# Patient Record
Sex: Female | Born: 1985 | Race: White | Hispanic: No | Marital: Married | Smoking: Never smoker
Health system: Southern US, Community
[De-identification: ages and names within clinical notes are randomized; demographics above are authoritative.]

## PROBLEM LIST (undated history)

## (undated) DIAGNOSIS — Z87440 Personal history of urinary (tract) infections: Secondary | ICD-10-CM

## (undated) DIAGNOSIS — O034 Incomplete spontaneous abortion without complication: Secondary | ICD-10-CM

## (undated) DIAGNOSIS — I10 Essential (primary) hypertension: Secondary | ICD-10-CM

## (undated) DIAGNOSIS — D509 Iron deficiency anemia, unspecified: Secondary | ICD-10-CM

## (undated) DIAGNOSIS — R011 Cardiac murmur, unspecified: Secondary | ICD-10-CM

## (undated) DIAGNOSIS — Z789 Other specified health status: Secondary | ICD-10-CM

## (undated) DIAGNOSIS — K5100 Ulcerative (chronic) pancolitis without complications: Secondary | ICD-10-CM

## (undated) DIAGNOSIS — Z8719 Personal history of other diseases of the digestive system: Secondary | ICD-10-CM

## (undated) DIAGNOSIS — Z973 Presence of spectacles and contact lenses: Secondary | ICD-10-CM

## (undated) DIAGNOSIS — A09 Infectious gastroenteritis and colitis, unspecified: Secondary | ICD-10-CM

## (undated) DIAGNOSIS — N390 Urinary tract infection, site not specified: Secondary | ICD-10-CM

## (undated) DIAGNOSIS — Z8632 Personal history of gestational diabetes: Secondary | ICD-10-CM

## (undated) DIAGNOSIS — K922 Gastrointestinal hemorrhage, unspecified: Secondary | ICD-10-CM

## (undated) HISTORY — PX: BREAST EXCISIONAL BIOPSY: SUR124

## (undated) HISTORY — DX: Infectious gastroenteritis and colitis, unspecified: A09

## (undated) HISTORY — DX: Gastrointestinal hemorrhage, unspecified: K92.2

## (undated) HISTORY — DX: Urinary tract infection, site not specified: N39.0

## (undated) HISTORY — DX: Essential (primary) hypertension: I10

---
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
## 2003-07-25 ENCOUNTER — Emergency Department (HOSPITAL_COMMUNITY): Admission: EM | Admit: 2003-07-25 | Discharge: 2003-07-25 | Payer: Self-pay | Admitting: Emergency Medicine

## 2003-08-08 ENCOUNTER — Other Ambulatory Visit: Admission: RE | Admit: 2003-08-08 | Discharge: 2003-08-08 | Payer: Self-pay | Admitting: Obstetrics and Gynecology

## 2003-08-11 ENCOUNTER — Encounter: Admission: RE | Admit: 2003-08-11 | Discharge: 2003-08-11 | Payer: Self-pay | Admitting: Obstetrics and Gynecology

## 2004-05-30 ENCOUNTER — Encounter (INDEPENDENT_AMBULATORY_CARE_PROVIDER_SITE_OTHER): Payer: Self-pay | Admitting: *Deleted

## 2004-05-30 ENCOUNTER — Encounter: Admission: RE | Admit: 2004-05-30 | Discharge: 2004-05-30 | Payer: Self-pay | Admitting: Obstetrics and Gynecology

## 2004-07-09 ENCOUNTER — Ambulatory Visit: Payer: Self-pay | Admitting: Internal Medicine

## 2004-08-06 ENCOUNTER — Ambulatory Visit: Payer: Self-pay | Admitting: Internal Medicine

## 2004-08-28 ENCOUNTER — Other Ambulatory Visit: Admission: RE | Admit: 2004-08-28 | Discharge: 2004-08-28 | Payer: Self-pay | Admitting: Obstetrics and Gynecology

## 2005-09-04 ENCOUNTER — Other Ambulatory Visit: Admission: RE | Admit: 2005-09-04 | Discharge: 2005-09-04 | Payer: Self-pay | Admitting: Obstetrics and Gynecology

## 2005-09-12 ENCOUNTER — Encounter: Admission: RE | Admit: 2005-09-12 | Discharge: 2005-09-12 | Payer: Self-pay | Admitting: Obstetrics and Gynecology

## 2007-12-15 ENCOUNTER — Ambulatory Visit (HOSPITAL_BASED_OUTPATIENT_CLINIC_OR_DEPARTMENT_OTHER): Admission: RE | Admit: 2007-12-15 | Discharge: 2007-12-15 | Payer: Self-pay | Admitting: Surgery

## 2007-12-15 ENCOUNTER — Encounter (INDEPENDENT_AMBULATORY_CARE_PROVIDER_SITE_OTHER): Payer: Self-pay | Admitting: Surgery

## 2007-12-15 HISTORY — PX: BREAST MASS EXCISION: SHX1267

## 2010-11-12 ENCOUNTER — Other Ambulatory Visit: Payer: Self-pay | Admitting: Obstetrics and Gynecology

## 2010-12-04 NOTE — Op Note (Signed)
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   POSTOPERATIVE DIAGNOSIS:  Left breast mass, probable fibroadenoma.   OPERATION:  Removal left breast mass.   SURGEON:  Haywood Lasso, MD.   ANESTHESIA:  MAC.   CLINICAL HISTORY:  This is a 25 year old young lady with a gradually  enlarging left breast mass, which appeared to be clinically benign  fibroadenoma.  However, because it was enlarging, she decided to have  this removed.   DESCRIPTION OF PROCEDURE:  The patient was seen in the holding area and  had no further questions.  We both marked the left breast as the  operative site.  The patient was then taken to the operating room, and  after satisfactory IV sedation, the left breast was prepped and draped.  The time-out was done.   I infiltrated a combination of 1% Xylocaine and 0.5% Marcaine with  epinephrine mixed equally for local.  I made a curvilinear incision from  about the 12 o'clock to the 9 o'clock position of the left breast at the  areolar margin.  Dividing a little bit of the subcutaneous tissue, I  could see a very lateral edge of the mass, has extended basically from  the areola medially.  Once I had this freed up a little bit, I put a  suture through it for traction.  Then using the cautery, I excised the  mass in toto and apparently intact.  It was very superficial and really  close to the skin.   Once it was out, I sent to pathology.  I made sure everything was dry  and then closed in layers with some 3-0 Vicryl and 4-0 Monocryl  subcuticular plus Dermabond.   The patient tolerated the  procedure well.  There were no complications.  All counts were correct.      Haywood Lasso, M.D.  Electronically Signed     CJS/MEDQ  D:  12/15/2007  T:  12/16/2007  Job:  [id number]   cc:   Lenard Galloway, M.D.

## 2011-02-20 ENCOUNTER — Encounter (HOSPITAL_COMMUNITY)
Admission: RE | Admit: 2011-02-20 | Discharge: 2011-02-20 | Disposition: A | Discharge status: Home / Self Care | Payer: Commercial Managed Care - PPO | Source: Ambulatory Visit | Attending: Obstetrics and Gynecology | Admitting: Obstetrics and Gynecology

## 2011-02-20 ENCOUNTER — Encounter (HOSPITAL_COMMUNITY): Payer: Self-pay

## 2011-02-20 ENCOUNTER — Inpatient Hospital Stay (HOSPITAL_COMMUNITY): Admission: RE | Admit: 2011-02-20 | Payer: 59 | Source: Ambulatory Visit

## 2011-02-20 HISTORY — DX: Other specified health status: Z78.9

## 2011-02-20 LAB — CBC
MCH: 29.4 pg (ref 26.0–34.0)
MCHC: 33.1 g/dL (ref 30.0–36.0)
Platelets: 230 10*3/uL (ref 150–400)
RDW: 12.9 % (ref 11.5–15.5)

## 2011-02-20 LAB — URINALYSIS, ROUTINE W REFLEX MICROSCOPIC
Glucose, UA: NEGATIVE mg/dL
Ketones, ur: NEGATIVE mg/dL
Leukocytes, UA: NEGATIVE
Nitrite: NEGATIVE
Protein, ur: NEGATIVE mg/dL
Urobilinogen, UA: 0.2 mg/dL (ref 0.0–1.0)

## 2011-02-20 NOTE — Patient Instructions (Addendum)
[place]  02/20/2011   Your procedure is scheduled on:  02/26/11  Report to [facility] at 8 AM.  Call this number if you have problems the morning of surgery: [phone]   Remember:   Do not eat food:After Midnight.  Do not drink clear liquids: After Midnight.  Take these medicines the morning of surgery with A SIP OF WATER: NA   Do not wear jewelry, make-up or nail polish.  Do not wear lotions, powders, or perfumes. You may wear deodorant.  Do not shave 48 hours prior to surgery.  Do not bring valuables to the hospital.  Contacts, dentures or bridgework may not be worn into surgery.  Leave suitcase in the car. After surgery it may be brought to your room.  For patients admitted to the hospital, checkout time is 11:00 AM the day of discharge.   Patients discharged the day of surgery will not be allowed to drive home.  Name and phone number of your driver: grandmother  Loretta Boone  Special Instructions: use CHG wash per written instruction sheet   Please read over the following fact sheets that you were given: MRSA Information

## 2011-02-25 NOTE — H&P (Signed)
NAMESAMIRA, ACERO NO.:  [id number]  MEDICAL RECORD NO.:  [id number]  LOCATION:  [place]                           FACILITY:  [place]  PHYSICIAN:  Lenard Galloway, M.D.   DATE OF BIRTH:  [birth year]  DATE OF ADMISSION:  02/20/2011 DATE OF DISCHARGE:  02/20/2011                             HISTORY & PHYSICAL   Preoperative history and physical examination dated on February 20, 2011. Surgery is scheduled for the [facility] on February 26, 2011.  CHIEF COMPLAINT:  Right lower quadrant pain.  HISTORY OF PRESENT ILLNESS:  The patient is a 25 year old gravida 0, Caucasian female, who presents with a history of progressive right lower quadrant pain beginning in April 2012.  The patient has had a pelvic ultrasound documenting a complex right ovarian cyst which measured 4.5 cm on Dec 12, 2010 and has slightly increased in size to 4.9 cm on February 01, 2011.  Neither ultrasound demonstrated any increase in blood flow by colored Doppler.  The left ovary has always remained normal and the uterus was on remarkable.  The patient has been on Ovcon birth control pills during this time.  She has been taking nonsteroidal antiinflammatory medication, tramadol, or extra strength Tylenol p.m. to try to control the discomfort.  The pain appears to be increasing and the patient thus requesting surgical evaluation and treatment.  PAST OBSTETRIC AND GYNECOLOGIC HISTORIES:  The patient is status post excision of a left breast fibroadenoma in 2009.  The patient is currently on Ovcon 35 birth control pills.  She has never been pregnant. The patient's last set of gonorrhea and Chlamydia cultures were performed in April 2011 and were both negative.  PAST MEDICAL HISTORY: 1. History of cat scratch disease. 2. TMJ. 3. Adult acne. 4. Environmental allergies  PAST SURGICAL HISTORY:  Status post excision of left breast fibroadenoma in 2009.  MEDICATIONS: 1. Ovcon 35 birth control  pills. 2. Clindamycin lotion. 3. Different cream. 4. Multivitamin. 5. Zyrtec.  ALLERGIES:  MINOCYCLINE.  SOCIAL HISTORY:  The patient is single.  She has no children.  She is a Equities trader.  She denies the use of tobacco, alcohol, or illicit drugs.  FAMILY HISTORY:  Negative for breast, uterine, ovarian, or colon cancer.  REVIEW OF SYSTEMS:  Please refer to history of present illness.  PHYSICAL EXAMINATION:  VITAL SIGNS:  Height of 5 feet 4 inches, weight 154 pounds, and blood pressure 100/64. NECK:  Negative for adenopathy and thyromegaly. LUNGS:  Clear to auscultation bilaterally. HEART:  S1 and S2 with a regular rate and rhythm. ABDOMEN:  Soft and nontender and without evidence of hepatosplenomegaly or organomegaly. PELVIC:  Normal external genitalia and urethra.  The cervix and vagina demonstrate no lesions.  The uterus is small and nontender.  There is a soft fullness in the right lower quadrant without evidence of tenderness.  There is no evidence of any left adnexal mass or tenderness.  IMPRESSION:  The patient is a 25 year old gravida 0 female with a persistent complex right ovarian cyst and right lower quadrant pain.  PLAN:  The patient will undergo a laparoscopic right ovarian cystectomy with possible right salpingo-oophorectomy.  Risks, benefits, and alternatives have been discussed  with the patient who wishes to proceed.     Lenard Galloway, M.D.     BES/MEDQ  D:  02/25/2011  T:  02/25/2011  Job:  [id number]

## 2011-02-26 ENCOUNTER — Ambulatory Visit (HOSPITAL_COMMUNITY)
Admission: RE | Admit: 2011-02-26 | Discharge: 2011-02-26 | Disposition: A | Discharge status: Home / Self Care | Payer: Commercial Managed Care - PPO | Source: Ambulatory Visit | Attending: Obstetrics and Gynecology | Admitting: Obstetrics and Gynecology

## 2011-02-26 ENCOUNTER — Other Ambulatory Visit: Payer: Self-pay | Admitting: Obstetrics and Gynecology

## 2011-02-26 ENCOUNTER — Encounter (HOSPITAL_COMMUNITY)
Admission: RE | Disposition: A | Discharge status: Home / Self Care | Payer: Self-pay | Source: Ambulatory Visit | Attending: Obstetrics and Gynecology

## 2011-02-26 ENCOUNTER — Ambulatory Visit (HOSPITAL_COMMUNITY): Payer: Commercial Managed Care - PPO | Admitting: Anesthesiology

## 2011-02-26 ENCOUNTER — Encounter (HOSPITAL_COMMUNITY): Payer: Self-pay | Admitting: Anesthesiology

## 2011-02-26 DIAGNOSIS — N83209 Unspecified ovarian cyst, unspecified side: Secondary | ICD-10-CM | POA: Insufficient documentation

## 2011-02-26 DIAGNOSIS — Z01812 Encounter for preprocedural laboratory examination: Secondary | ICD-10-CM | POA: Insufficient documentation

## 2011-02-26 DIAGNOSIS — R1031 Right lower quadrant pain: Secondary | ICD-10-CM | POA: Insufficient documentation

## 2011-02-26 DIAGNOSIS — Z01818 Encounter for other preprocedural examination: Secondary | ICD-10-CM | POA: Insufficient documentation

## 2011-02-26 HISTORY — PX: OVARIAN CYST REMOVAL: SHX89

## 2011-02-26 HISTORY — PX: LAPAROSCOPY: SHX197

## 2011-02-26 SURGERY — LAPAROSCOPY OPERATIVE
Anesthesia: General | Site: Abdomen | Laterality: Right

## 2011-02-26 MED ORDER — MIDAZOLAM HCL 2 MG/2ML IJ SOLN
INTRAMUSCULAR | Status: AC
  Filled 2011-02-26: qty 2

## 2011-02-26 MED ORDER — NEOSTIGMINE METHYLSULFATE 1 MG/ML IJ SOLN
INTRAMUSCULAR | Status: DC | PRN
  Administered 2011-02-26: 2 mg via INTRAMUSCULAR

## 2011-02-26 MED ORDER — FENTANYL CITRATE 0.05 MG/ML IJ SOLN
INTRAMUSCULAR | Status: AC
  Administered 2011-02-26: 25 ug via INTRAVENOUS
  Filled 2011-02-26: qty 2

## 2011-02-26 MED ORDER — CEFAZOLIN SODIUM 1-5 GM-% IV SOLN
INTRAVENOUS | Status: AC
  Filled 2011-02-26: qty 50

## 2011-02-26 MED ORDER — LACTATED RINGERS IR SOLN
Status: DC | PRN
  Administered 2011-02-26: 3000 mL

## 2011-02-26 MED ORDER — BUPIVACAINE HCL (PF) 0.25 % IJ SOLN
INTRAMUSCULAR | Status: DC | PRN
  Administered 2011-02-26: 10 mL

## 2011-02-26 MED ORDER — ROCURONIUM BROMIDE 50 MG/5ML IV SOLN
INTRAVENOUS | Status: AC
  Filled 2011-02-26: qty 1

## 2011-02-26 MED ORDER — FENTANYL CITRATE 0.05 MG/ML IJ SOLN
INTRAMUSCULAR | Status: DC | PRN
  Administered 2011-02-26: 100 ug via INTRAVENOUS
  Administered 2011-02-26: 150 ug via INTRAVENOUS

## 2011-02-26 MED ORDER — KETOROLAC TROMETHAMINE 30 MG/ML IJ SOLN: 15.0000 mg | Freq: Once | INTRAMUSCULAR | Status: DC | PRN

## 2011-02-26 MED ORDER — PANTOPRAZOLE SODIUM 40 MG PO TBEC: 40.0000 mg | DELAYED_RELEASE_TABLET | Freq: Once | ORAL | Status: DC | PRN

## 2011-02-26 MED ORDER — GLYCOPYRROLATE 0.2 MG/ML IJ SOLN
INTRAMUSCULAR | Status: DC | PRN
  Administered 2011-02-26: .4 mg via INTRAVENOUS

## 2011-02-26 MED ORDER — CITRIC ACID-SODIUM CITRATE 334-500 MG/5ML PO SOLN: 30.0000 mL | Freq: Once | ORAL | Status: DC | PRN

## 2011-02-26 MED ORDER — LIDOCAINE HCL (CARDIAC) 20 MG/ML IV SOLN
INTRAVENOUS | Status: AC
  Filled 2011-02-26: qty 5

## 2011-02-26 MED ORDER — MIDAZOLAM HCL 5 MG/5ML IJ SOLN
INTRAMUSCULAR | Status: DC | PRN
  Administered 2011-02-26: 2 mg via INTRAVENOUS

## 2011-02-26 MED ORDER — LIDOCAINE HCL (PF) 2 % IJ SOLN
INTRAMUSCULAR | Status: DC | PRN
  Administered 2011-02-26: 100 mg

## 2011-02-26 MED ORDER — KETOROLAC TROMETHAMINE 30 MG/ML IJ SOLN
INTRAMUSCULAR | Status: DC | PRN
  Administered 2011-02-26: 30 mg via INTRAVENOUS

## 2011-02-26 MED ORDER — ONDANSETRON HCL 4 MG/2ML IJ SOLN
INTRAMUSCULAR | Status: DC | PRN
  Administered 2011-02-26: 4 mg via INTRAVENOUS

## 2011-02-26 MED ORDER — FENTANYL CITRATE 0.05 MG/ML IJ SOLN
25.0000 ug | INTRAMUSCULAR | Status: DC | PRN
  Administered 2011-02-26 (×2): 25 ug via INTRAVENOUS

## 2011-02-26 MED ORDER — PROPOFOL 10 MG/ML IV EMUL
INTRAVENOUS | Status: AC
  Filled 2011-02-26: qty 20

## 2011-02-26 MED ORDER — FENTANYL CITRATE 0.05 MG/ML IJ SOLN
INTRAMUSCULAR | Status: AC
  Filled 2011-02-26: qty 5

## 2011-02-26 MED ORDER — CEFAZOLIN SODIUM 1-5 GM-% IV SOLN
1.0000 g | INTRAVENOUS | Status: AC
  Administered 2011-02-26: 1 g via INTRAVENOUS

## 2011-02-26 MED ORDER — SCOPOLAMINE 1 MG/3DAYS TD PT72: 1.0000 | MEDICATED_PATCH | Freq: Once | TRANSDERMAL | Status: DC | PRN

## 2011-02-26 MED ORDER — ROCURONIUM BROMIDE 100 MG/10ML IV SOLN
INTRAVENOUS | Status: DC | PRN
  Administered 2011-02-26: 40 mg via INTRAVENOUS

## 2011-02-26 MED ORDER — ONDANSETRON HCL 4 MG/2ML IJ SOLN
INTRAMUSCULAR | Status: AC
  Filled 2011-02-26: qty 2

## 2011-02-26 MED ORDER — METOCLOPRAMIDE HCL 10 MG PO TABS: 10.0000 mg | ORAL_TABLET | Freq: Once | ORAL | Status: DC | PRN

## 2011-02-26 MED ORDER — HEPARIN SODIUM (PORCINE) 5000 UNIT/ML IJ SOLN
INTRAMUSCULAR | Status: DC | PRN
  Administered 2011-02-26: 5000 [IU] via SUBCUTANEOUS

## 2011-02-26 MED ORDER — PROPOFOL 10 MG/ML IV EMUL
INTRAVENOUS | Status: DC | PRN
  Administered 2011-02-26: 150 mg via INTRAVENOUS

## 2011-02-26 MED ORDER — GLYCOPYRROLATE 0.2 MG/ML IJ SOLN
INTRAMUSCULAR | Status: AC
  Filled 2011-02-26: qty 2

## 2011-02-26 MED ORDER — DEXAMETHASONE SODIUM PHOSPHATE 10 MG/ML IJ SOLN
INTRAMUSCULAR | Status: AC
  Filled 2011-02-26: qty 1

## 2011-02-26 MED ORDER — NEOSTIGMINE METHYLSULFATE 1 MG/ML IJ SOLN
INTRAMUSCULAR | Status: AC
  Filled 2011-02-26: qty 10

## 2011-02-26 MED ORDER — DEXAMETHASONE SODIUM PHOSPHATE 10 MG/ML IJ SOLN
INTRAMUSCULAR | Status: DC | PRN
  Administered 2011-02-26: 10 mg via INTRAVENOUS

## 2011-02-26 MED ORDER — KETOROLAC TROMETHAMINE 30 MG/ML IJ SOLN
INTRAMUSCULAR | Status: AC
  Filled 2011-02-26: qty 1

## 2011-02-26 MED ORDER — FAMOTIDINE 20 MG PO TABS: 20.0000 mg | ORAL_TABLET | Freq: Once | ORAL | Status: DC | PRN

## 2011-02-26 MED ORDER — LACTATED RINGERS IV SOLN
INTRAVENOUS | Status: DC
  Administered 2011-02-26 (×2): via INTRAVENOUS
  Administered 2011-02-26: 20 mL/h via INTRAVENOUS

## 2011-02-26 MED ORDER — OXYCODONE-ACETAMINOPHEN 5-325 MG PO TABS: 1.0000 | ORAL_TABLET | ORAL | Status: AC | PRN

## 2011-02-26 SURGICAL SUPPLY — 26 items
BARRIER ADHS 3X4 INTERCEED (GAUZE/BANDAGES/DRESSINGS) ×3 IMPLANT
BENZOIN TINCTURE PRP APPL 2/3 (GAUZE/BANDAGES/DRESSINGS) IMPLANT
CABLE HIGH FREQUENCY MONO STRZ (ELECTRODE) ×3 IMPLANT
CANISTER SUCTION 2500CC (MISCELLANEOUS) ×3 IMPLANT
CLOTH BEACON ORANGE TIMEOUT ST (SAFETY) ×3 IMPLANT
DERMABOND ADVANCED (GAUZE/BANDAGES/DRESSINGS) ×6 IMPLANT
DRAPE UTILITY XL STRL (DRAPES) IMPLANT
DRESSING TELFA 8X3 (GAUZE/BANDAGES/DRESSINGS) ×3 IMPLANT
FORCEPS CUTTING 33CM 5MM (CUTTING FORCEPS) IMPLANT
GLOVE BIO SURGEON STRL SZ 6.5 (GLOVE) ×6 IMPLANT
GLOVE BIO SURGEON STRL SZ8 (GLOVE) IMPLANT
GOWN PREVENTION PLUS LG XLONG (DISPOSABLE) ×6 IMPLANT
NS IRRIG 1000ML POUR BTL (IV SOLUTION) IMPLANT
PACK LAPAROSCOPY BASIN (CUSTOM PROCEDURE TRAY) ×3 IMPLANT
POUCH SPECIMEN RETRIEVAL 10MM (ENDOMECHANICALS) ×3 IMPLANT
SCISSORS LAP 5X35 DISP (ENDOMECHANICALS) ×3 IMPLANT
SET IRRIG TUBING LAPAROSCOPIC (IRRIGATION / IRRIGATOR) ×3 IMPLANT
SOLUTION ELECTROLUBE (MISCELLANEOUS) IMPLANT
STRIP CLOSURE SKIN 1/4X3 (GAUZE/BANDAGES/DRESSINGS) IMPLANT
SUT PLAIN 3 0 FS 2 27 (SUTURE) ×3 IMPLANT
SUT VICRYL 0 UR6 27IN ABS (SUTURE) ×3 IMPLANT
TOWEL OR 17X24 6PK STRL BLUE (TOWEL DISPOSABLE) ×6 IMPLANT
TRAY FOLEY CATH 14FR (SET/KITS/TRAYS/PACK) ×3 IMPLANT
TROCAR Z-THREAD BLADED 11X100M (TROCAR) ×3 IMPLANT
WARMER LAPAROSCOPE (MISCELLANEOUS) ×3 IMPLANT
WATER STERILE IRR 1000ML POUR (IV SOLUTION) IMPLANT

## 2011-02-26 NOTE — Anesthesia Postprocedure Evaluation (Signed)
Anesthesia Post Note  Patient: Loretta Boone  Procedure(s) Performed:  LAPAROSCOPY OPERATIVE; OVARIAN CYSTECTOMY  Anesthesia type: General  Patient location: PACU  Post pain: Pain level controlled  Post assessment: Post-op Vital signs reviewed  Last Vitals:  Filed Vitals:   02/26/11 1145  BP: 108/58  Pulse: 72  Temp:   Resp: 14    Post vital signs: Reviewed  Level of consciousness: sedated  Complications: No apparent anesthesia complications

## 2011-02-26 NOTE — Anesthesia Procedure Notes (Signed)
Procedures

## 2011-02-26 NOTE — H&P (Signed)
NAMEJAMES, LAFALCE NO.:  [id number]      MEDICAL RECORD NO.:  [id number]      LOCATION:  [place]                           FACILITY:  [facility]      PHYSICIAN:  Lenard Galloway, M.D.   DATE OF BIRTH:  22-Oct-1985      DATE OF ADMISSION:  02/20/2011   DATE OF DISCHARGE:  02/20/2011                                 HISTORY & PHYSICAL         Preoperative history and physical examination dated on February 20, 2011.   Surgery is scheduled for the [facility] on February 26, 2011.      CHIEF COMPLAINT:  Right lower quadrant pain.      HISTORY OF PRESENT ILLNESS:  The patient is a 25 year old gravida 0,   Caucasian female, who presents with a history of progressive right lower   quadrant pain beginning in April 2012.  The patient has had a pelvic   ultrasound documenting a complex right ovarian cyst which measured 4.5   cm on Dec 12, 2010 and has slightly increased in size to 4.9 cm on February 01, 2011.  Neither ultrasound demonstrated any increase in blood flow by   colored Doppler.  The left ovary has always remained normal and the   uterus was on remarkable.  The patient has been on Ovcon birth control   pills during this time.  She has been taking nonsteroidal   antiinflammatory medication, tramadol, or extra strength Tylenol p.m. to   try to control the discomfort.  The pain appears to be increasing and   the patient thus requesting surgical evaluation and treatment.      PAST OBSTETRIC AND GYNECOLOGIC HISTORIES:  The patient is status post   excision of a left breast fibroadenoma in 2009.  The patient is   currently on Ovcon 35 birth control pills.  She has never been pregnant.   The patient's last set of gonorrhea and Chlamydia cultures were   performed in April 2011 and were both negative.      PAST MEDICAL HISTORY:   1. History of cat scratch disease.   2. TMJ.   3. A dull acne.   4. Environmental allergies      PAST SURGICAL HISTORY:  Status post  excision of left breast fibroadenoma   in 2009.      MEDICATIONS:   1. Ovcon 35 birth control pills.   2. Clindamycin lotion.   3. Different cream.   4. Multivitamin.   5. Zyrtec.      ALLERGIES:  MINOCYCLINE.      SOCIAL HISTORY:  The patient is single.  She has no children.  She is a   Equities trader.  She denies the use of tobacco, alcohol, or illicit   drugs.      FAMILY HISTORY:  Negative for breast, uterine, ovarian, or colon cancer.      REVIEW OF SYSTEMS:  Please refer to history of present illness.      PHYSICAL EXAMINATION:  VITAL SIGNS:  Height of 5 feet 4 inches, weight   154 pounds, and blood pressure  100/64.   NECK:  Negative for adenopathy and thyromegaly.   LUNGS:  Clear to auscultation bilaterally.   HEART:  S1 and S2 with a regular rate and rhythm.   ABDOMEN:  Soft and nontender and without evidence of hepatosplenomegaly   or organomegaly.   PELVIC:  Normal external genitalia and urethra.  The cervix and vagina   demonstrate no lesions.  The uterus is small and nontender.  There is a   soft fullness in the right lower quadrant without evidence of   tenderness.  There is no evidence of any left adnexal mass or   tenderness.      IMPRESSION:  The patient is a 25 year old gravida 0 female with a   persistent complex right ovarian cyst and right lower quadrant pain.      PLAN:  The patient will undergo a laparoscopic right ovarian cystectomy   with possible right salpingo-oophorectomy.  Risks, benefits, and   alternatives have been discussed with the patient who wishes to proceed.               Lenard Galloway, M.D.               BES/MEDQ  D:  02/25/2011  T:  02/25/2011  Job:  [id number]

## 2011-02-26 NOTE — Discharge Instructions (Addendum)
Laparoscopy, Diagnostic Laparoscopy is a surgical procedure. It is used to diagnose and treat diseases inside the belly (abdomen). It is usually a brief, common, and relatively simple procedure. The laparoscope is a thin, lighted, pencil-sized instrument. It is like a telescope. It is inserted into your abdomen through a small cut (incision). Your caregiver can look at the organs inside your body through this instrument. He or she can see if there is anything abnormal. LAPAROSCOPY OVERVIEW Laparoscopy can be done either in a hospital or outpatient clinic. You may be given a mild sedative to help you relax before the procedure. Once in the operating room, you will be given a drug to make you sleep (general anesthesia). Laparoscopy usually lasts less than 1 hour. After the procedure, you will be monitored in a recovery area until you are stable and doing well. Once you are home, it will take 2 to 3 days to fully recover. RISKS AND COMPLICATIONS Laparoscopy has relatively few risks. Your caregiver will discuss the risks with you before the procedure. Some problems that can occur include:  Infection.   Bleeding.   Damage to other organs.   Anesthetic side effects.  THE PROCEDURE Once you receive anesthesia, your surgeon inflates the abdomen with a harmless gas (carbon dioxide). This makes the organs easier to see. The laparoscope is inserted into the abdomen through a small incision. This allows your surgeon to see into the abdomen. Other small instruments are also inserted into the abdomen through other small openings. Many surgeons attach a video camera to the laparoscope to enlarge the view. During a diagnostic laparoscopy, the surgeon may be looking for inflammation, infection, or cancer. Your surgeon may take tissue samples (biopsies). The samples are sent to a specialist in looking at cells and tissue samples (pathologist). The pathologist examines them under a microscope. Biopsies can help  to diagnose or confirm a disease. AFTER THE PROCEDURE  The gas is released from inside the abdomen.   The incisions are closed with stitches (sutures). Because these incisions are small (usually less than 1/2 inch), there is usually minimal discomfort after the procedure. There may be some mild discomfort in the throat. This is from the tube placed in the throat while you were sleeping. You may have some mild abdominal discomfort. There may also be discomfort from the instrument placement incisions in the abdomen.   The recovery time is shortened as long as there are no complications.   You will rest in a recovery room until stable and doing well. As long as there are no complications, you may be allowed to go home.  HOME CARE INSTRUCTIONS  Take all medicines as directed.   Only take over-the-counter or prescription medicines for pain, discomfort, or fever as directed by your caregiver.   Resume daily activities as directed.   Showers are preferred over baths.   You may resume sexual activities in 1 week or as directed.   Do not drive while taking narcotics.  FINDING OUT THE RESULTS OF YOUR TEST Not all test results are available during your visit. If your test results are not back during the visit, make an appointment with your caregiver to find out the results. Do not assume everything is normal if you have not heard from your caregiver or the medical facility. It is important for you to follow up on all of your test results. SEEK MEDICAL CARE IF:  There is increasing abdominal pain.   There is new pain in the  shoulders (shoulder strap areas).   You feel lightheaded or faint.   You have the chills.   You or your child has an oral temperature above 100.5.   There is pus-like (purulent) drainage from any of the wounds.   You are unable to pass gas or have a bowel movement.   You feel sick to your stomach (nauseous) or throw up (vomit).  MAKE SURE YOU:  Understand these  instructions.   Will watch your condition.   Will get help right away if you are not doing well or get worse.  Document Released: 10/14/2000 Document Re-Released: 10/02/2009 [facility] Patient Information 2011 [place].   Caryl Pina,  I found and removed a right ovarian cyst.  It looked mucinous inside.  Your procedure went well!  Josefa Half, M.D.

## 2011-02-26 NOTE — Brief Op Note (Signed)
02/26/2011  10:51 AM  PATIENT:  Loretta Boone  25 y.o. female  PRE-OPERATIVE DIAGNOSIS:  right ovary cyst;right lower quad pain  POST-OPERATIVE DIAGNOSIS:  right ovary cyst;right lower quad pain  PROCEDURE:  Procedure(s): LAPAROSCOPY OPERATIVE OVARIAN CYSTECTOMY  SURGEON:  Surgeon(s): Brook A Silva  PHYSICIAN ASSISTANT:   ASSISTANTS: Delene Loll  ANESTHESIA:   general  ESTIMATED BLOOD LOSS: * No blood loss amount entered *   BLOOD ADMINISTERED:none  DRAINS: none   LOCAL MEDICATIONS USED:  MARCAINE 10 CC  SPECIMEN:  Source of Specimen:  right ovarian cyst wall and pelvic washings  DISPOSITION OF SPECIMEN:  PATHOLOGY  COUNTS:  YES  TOURNIQUET:  * No tourniquets in log *  DICTATION #: 0  PLAN OF CARE: Discharge home.  PATIENT DISPOSITION:  To recovery room in stable, extubated condition.   Delay start of Pharmacological VTE agent (>24hrs) due to surgical blood loss or risk of bleeding:  /NOT APPLICABLE}

## 2011-02-26 NOTE — Transfer of Care (Signed)
Immediate Anesthesia Transfer of Care Note  Patient: Loretta Boone  Procedure(s) Performed:  LAPAROSCOPY OPERATIVE; OVARIAN CYSTECTOMY  Patient Location: PACU  Anesthesia Type: General  Level of Consciousness: oriented and sedated  Airway & Oxygen Therapy: Patient Spontanous Breathing and Patient connected to nasal cannula oxygen  Post-op Assessment: Report given to PACU RN and Post -op Vital signs reviewed and stable  Post vital signs: Reviewed and stable  Complications: No apparent anesthesia complications

## 2011-02-26 NOTE — Progress Notes (Signed)
Pre op Note  Patient prepared for surgery.  Cyst is on the right ovary.

## 2011-02-26 NOTE — Anesthesia Preprocedure Evaluation (Signed)
Anesthesia Evaluation  Name, MR# and DOB Patient awake  General Assessment Comment  Reviewed: Allergy & Precautions, H&P , NPO status , Patient's Chart, lab work & pertinent test results, reviewed documented beta blocker date and time   Airway Mallampati: I TM Distance: >3 FB Neck ROM: full    Dental  (+) Teeth Intact   Pulmonary  clear to auscultation  breath sounds clear to auscultation none    Cardiovascular Exercise Tolerance: Good regular Normal    Neuro/Psych   GI/Hepatic/Renal   Endo/Other    Abdominal   Musculoskeletal   Hematology   Peds  Reproductive/Obstetrics    Anesthesia Other Findings On spironolactone for acne            Anesthesia Physical Anesthesia Plan  ASA: I  Anesthesia Plan: General   Post-op Pain Management:    Induction:   Airway Management Planned: Oral ETT  Additional Equipment:   Intra-op Plan:   Post-operative Plan:   Informed Consent: I have reviewed the patients History and Physical, chart, labs and discussed the procedure including the risks, benefits and alternatives for the proposed anesthesia with the patient or authorized representative who has indicated his/her understanding and acceptance.   Dental Advisory Given  Plan Discussed with: CRNA and Surgeon  Anesthesia Plan Comments:         Anesthesia Quick Evaluation

## 2011-02-26 NOTE — Op Note (Signed)
Loretta Boone, Loretta Boone NO.:  [id number]  MEDICAL RECORD NO.:  [id number]  LOCATION:  [facility]                          FACILITY:  [facility]  PHYSICIAN:  Lenard Galloway, M.D.   DATE OF BIRTH:  01-Aug-1985  DATE OF PROCEDURE: DATE OF DISCHARGE:  02/26/2011                              OPERATIVE REPORT   PREOPERATIVE DIAGNOSIS:  Complex right adnexal mass.  POSTOPERATIVE DIAGNOSIS:  Complex right adnexal mass.  PROCEDURE:  Laparoscopic right ovarian cystectomy with collection of pelvic washings.  SURGEON:  Lenard Galloway, MD  ASSISTANT:  Delene Loll, MD  ANESTHESIA:  General endotracheal, local with 0.25% Marcaine, 10 mL.  IV FLUIDS:  1500 mL Ringer's lactate.  ESTIMATED BLOOD LOSS:  Minimal.  URINE OUTPUT:  500 mL.  COMPLICATIONS:  None.  INDICATIONS FOR PROCEDURE:  The patient is a 25 year old gravida 62 Caucasian female who presents with onset of right lower quadrant pain in April 2012.  On ultrasound, the patient is noted to have a complex right ovarian cyst with no abnormal blood flow on Doppler studies.  The patient has been on birth control pills and the right ovarian cyst is persistent and the patient's pain is increasing.  A plan is therefore made to proceed with a laparoscopic right ovarian cystectomy after risks, benefits, and alternatives are reviewed.  FINDINGS:  Laparoscopy demonstrated a 7 cm smooth appearing right ovarian cyst.  The bilateral tubes, left ovary, and uterus were unremarkable.  There was a minor amount of whitish scarring along the left pelvic sidewall but no signs of endometriosis throughout the abdomen or pelvis.  There was no evidence of any adhesive disease. There were no excrescences of the peritoneal surfaces or visceral surfaces.  In the upper abdomen, the liver was unremarkable.  The appendix was normal and followed all the way to its tip.  The right ovarian cyst did rupture during the cystectomy and mucinous fluid was  noted.  It was irrigated well and suctioned from the pelvis.  SPECIMENS:  The right ovarian cyst and pelvic washings were sent to Pathology separately.  PROCEDURE:  The patient was reidentified in the preoperative hold area. She received Ancef IV for antibiotic prophylaxis.  She received PAS stockings for DVT prophylaxis.  In the operating room, general endotracheal anesthesia was induced and the patient was then placed in the dorsal lithotomy position.  The abdomen, vagina, and perineum were sterilely prepped and draped.  A Foley catheter was placed inside the bladder.  A speculum was placed inside the vagina and a single-tooth tenaculum was placed on the anterior cervical lip.  This was replaced with a Hulka tenaculum and the remaining vaginal instruments were removed.  The procedure began by creating a 1 cm vertical inferior umbilical incision with a scalpel.  Dissection down to the fascia was performed with an Allis clamp.  An attempt was made to proceed with direct trocar placement.  The patient had a firm abdominal wall.  This was abandoned and a Veress needle instead was placed inside the peritoneal cavity.  A drop test was performed with Ringer's lactate and the drop flowed freely.  After achieving a CO2 pneumoperitoneum, the Veress needle was removed and a 10-mm trocar was  placed without difficulty.  The laparoscope confirmed proper placement.  A CO2 pneumoperitoneum was achieved.  The patient was placed in the Trendelenburg position.  A 5-mm right lower quadrant incision was created and a 5-mm trocar placed under visualization of the laparoscope.  A 10/11 mm incision was then created in the right lower quadrant and a disposable trocar was placed under direct visualization of the laparoscope.  An inspection of the pelvic and abdominal organs was performed.  Nezhat suction irrigator was next used to collect pelvic washings and these were sent to Pathology.  The  procedure began by grasping the right utero-ovarian ligament while a linear incision was created in the ovarian cortex using monopolar cautery.  The cyst wall was identified.  Blunt dissection was used to try to shell the ovarian cyst from the surrounding ovarian cortex. During this process, the cyst was entered and the mucinous fluid was noted.  All of the cystic contents were then suctioned.  The cyst wall was then grasped and was dissected bluntly away from the remaining ovarian cortex.  Any remaining small pieces of ovarian cyst wall were peeled away from the ovarian cortex and all of the specimen was removed and sent to Pathology.  The pelvis was then copiously irrigated and suctioned.  Hemostasis was excellent.  A piece of the Interceed was placed around the right tube and ovary. The procedure was complete at this time.  The right and left lower quadrant and trocars were removed under visualization of the laparoscope.  The umbilical trocar and the laparoscope were removed simultaneously.  The right lower quadrant incision was closed with a figure-of-eight suture of 0 Vicryl along the fascia.  All skin incisions were closed with 3-0 plain gut suture.  The incisions were injected locally with 0.25% Marcaine and Dermabond was placed over all incisions.  The Hulka tenaculum and the Foley catheter were removed.  The patient was cleansed with Betadine.  She was awakened and extubated and escorted to the recovery room in stable and awake condition.  There were no complications to the procedure.  All needle, instrument, and sponge counts were correct.     Lenard Galloway, M.D.     BES/MEDQ  D:  02/26/2011  T:  02/26/2011  Job:  [id number]

## 2011-02-27 NOTE — Transfer of Care (Incomplete)
Immediate Anesthesia Transfer of Care Note  Patient: Loretta Boone  Procedure(s) Performed:  LAPAROSCOPY OPERATIVE; OVARIAN CYSTECTOMY  Patient Location: {PLACES; ANE POST:19477::"PACU"}  Anesthesia Type: {PROCEDURES; ANE POST ANESTHESIA TYPE:19480}  Level of Consciousness: {FINDINGS; ANE POST LEVEL OF CONSCIOUSNESS:19484}  Airway & Oxygen Therapy: {Exam; oxygen device:30095}  Post-op Assessment: {ASSESSMENT;POST-OP LSLHTD:42876}  Post vital signs: {DESC; ANE POST OTLXBW:62035}  Complications: {FINDINGS; ANE POST COMPLICATIONS:19485}

## 2011-03-25 ENCOUNTER — Encounter (HOSPITAL_COMMUNITY): Payer: Self-pay | Admitting: Obstetrics and Gynecology

## 2011-06-27 ENCOUNTER — Other Ambulatory Visit: Payer: Self-pay | Admitting: Dermatology

## 2012-09-02 ENCOUNTER — Other Ambulatory Visit: Payer: Self-pay | Admitting: Dermatology

## 2013-01-01 ENCOUNTER — Other Ambulatory Visit: Payer: Self-pay | Admitting: Obstetrics and Gynecology

## 2013-02-18 ENCOUNTER — Other Ambulatory Visit: Payer: Self-pay

## 2014-02-28 ENCOUNTER — Other Ambulatory Visit: Payer: Self-pay

## 2015-03-13 ENCOUNTER — Other Ambulatory Visit: Payer: Self-pay

## 2015-04-16 ENCOUNTER — Emergency Department (HOSPITAL_BASED_OUTPATIENT_CLINIC_OR_DEPARTMENT_OTHER)
Admission: EM | Admit: 2015-04-16 | Discharge: 2015-04-16 | Disposition: A | Discharge status: Home / Self Care | Payer: Commercial Managed Care - PPO | Attending: Emergency Medicine | Admitting: Emergency Medicine

## 2015-04-16 ENCOUNTER — Encounter (HOSPITAL_BASED_OUTPATIENT_CLINIC_OR_DEPARTMENT_OTHER): Payer: Self-pay

## 2015-04-16 DIAGNOSIS — Z79899 Other long term (current) drug therapy: Secondary | ICD-10-CM | POA: Diagnosis not present

## 2015-04-16 DIAGNOSIS — M79674 Pain in right toe(s): Secondary | ICD-10-CM | POA: Diagnosis present

## 2015-04-16 DIAGNOSIS — L600 Ingrowing nail: Secondary | ICD-10-CM | POA: Insufficient documentation

## 2015-04-16 MED ORDER — SULFAMETHOXAZOLE-TRIMETHOPRIM 800-160 MG PO TABS: 1.0000 | ORAL_TABLET | Freq: Two times a day (BID) | ORAL | Status: DC

## 2015-04-16 MED ORDER — CEPHALEXIN 500 MG PO CAPS: 500.0000 mg | ORAL_CAPSULE | Freq: Four times a day (QID) | ORAL | Status: DC

## 2015-04-16 MED ORDER — SODIUM BICARBONATE 4 % IV SOLN
5.0000 mL | Freq: Once | INTRAVENOUS | Status: DC
  Filled 2015-04-16: qty 5

## 2015-04-16 MED ORDER — LIDOCAINE HCL (PF) 1 % IJ SOLN
5.0000 mL | Freq: Once | INTRAMUSCULAR | Status: DC
  Filled 2015-04-16: qty 5

## 2015-04-16 NOTE — ED Provider Notes (Signed)
CSN: [id number]     Arrival date & time 04/16/15  1925 History  This chart was scribed for Davonna Belling, MD by Helane Gunther, ED Scribe. This patient was seen in room MH10/MH10 and the patient's care was started at 8:44 PM.    Chief Complaint  Patient presents with  . Toe Pain   The history is provided by the patient. No language interpreter was used.   HPI Comments: Loretta Boone is a 29 y.o. female who presents to the Emergency Department complaining of right big toe pain onset 5 days ago. She reports associated swelling under the nail. Pt denies purulent discharge.  Past Medical History  Diagnosis Date  . No pertinent past medical history    Past Surgical History  Procedure Laterality Date  . Breast lumpectomy  '09    left  . Laparoscopy  02/26/2011    Procedure: LAPAROSCOPY OPERATIVE;  Surgeon: Arloa Koh;  Location: [facility];  Service: Gynecology;  Laterality: N/A;  . Ovarian cyst removal  02/26/2011    Procedure: OVARIAN CYSTECTOMY;  Surgeon: Arloa Koh;  Location: [facility];  Service: Gynecology;  Laterality: Right;   No family history on file. Social History  Substance Use Topics  . Smoking status: Never Smoker   . Smokeless tobacco: None  . Alcohol Use: Yes     Comment: occasionally   OB History    No data available     Review of Systems  Skin: Positive for color change.  All other systems reviewed and are negative.   Allergies  Minocycline base and Minocycline  Home Medications   Prior to Admission medications   Medication Sig Start Date End Date Taking? Authorizing Majesty Stehlin  cetirizine (ZYRTEC) 10 MG tablet Take 10 mg by mouth daily.      Historical Isobelle Tuckett, MD  Multiple Vitamin (MULTIVITAMIN) tablet Take 1 tablet by mouth daily.      Historical Mylo Driskill, MD  norethindrone-ethinyl estradiol (OVCON-35,BALZIVA,BRIELLYN) 0.4-35 MG-MCG tablet Take 1 tablet by mouth daily.      Historical Anel Creighton, MD   BP 158/101 mmHg  Pulse 83  Temp(Src) 98.2 F  (36.8 C) (Oral)  Resp 18  Ht 5' 3"  (1.6 m)  Wt 150 lb (68.04 kg)  BMI 26.58 kg/m2  SpO2 100%  LMP 04/02/2015 Physical Exam  Constitutional: She is oriented to person, place, and time. She appears well-developed and well-nourished.  Pulmonary/Chest: Effort normal.  Abdominal: She exhibits no distension.  Musculoskeletal: Normal range of motion.  Neurological: She is alert and oriented to person, place, and time.  Skin: There is erythema.  R great toe erythema laterally with some crusting where nail fold meets nail. No fluctuance or drainage. No TTP on mid foot or toe otherwise.  Psychiatric: She has a normal mood and affect.  Nursing note and vitals reviewed.   ED Course  NAIL REMOVAL Date/Time: 04/16/2015 10:00 PM Performed by: Davonna Belling Authorized by: Davonna Belling Consent: Verbal consent obtained. Written consent not obtained. Risks and benefits: risks, benefits and alternatives were discussed Location: right foot Location details: right big toe Anesthesia: digital block Local anesthetic: lidocaine 1% without epinephrine and NaHCO3 (sodium bicarbonate) Anesthetic total: 2 ml Patient sedated: no Preparation: skin prepped with Betadine Amount removed: 1/5 Nail removed location: medial. Wedge excision of skin of nail fold: no Nail matrix removed: none Dressing: 4x4 Patient tolerance: Patient tolerated the procedure well with no immediate complications    DIAGNOSTIC STUDIES: Oxygen Saturation is 100% on RA, normal by my interpretation.  COORDINATION OF CARE: 8:46 PM - Discussed plans to order a digital block to examine more closely. Pt advised of plan for treatment and pt agrees.  Labs Review Labs Reviewed - No data to display  Imaging Review No results found. I have personally reviewed and evaluated these images and lab results as part of my medical decision-making.   EKG Interpretation None      MDM   Final diagnoses:  None    Patient  with toe pain. Ingrown toenail with some infection without clear abscess. Trimmed nail back. Will discharge the antibiotics.  I personally performed the services described in this documentation, which was scribed in my presence. The recorded information has been reviewed and is accurate.      Davonna Belling, MD 04/17/15 [phone]

## 2015-04-16 NOTE — Discharge Instructions (Signed)
Infected Ingrown Toenail An infected ingrown toenail occurs when the nail edge grows into the skin and bacteria invade the area. Symptoms include pain, tenderness, swelling, and pus drainage from the edge of the nail. Poorly fitting shoes, minor injuries, and improper cutting of the toenail may also contribute to the problem. You should cut your toenails squarely instead of rounding the edges. Do not cut them too short. Avoid tight or pointed toe shoes. Sometimes the ingrown portion of the nail must be removed. If your toenail is removed, it can take 3-4 months for it to re-grow. HOME CARE INSTRUCTIONS   Soak your infected toe in warm water for 20-30 minutes, 2 to 3 times a day.  Packing or dressings applied to the area should be changed daily.  Take medicine as directed and finish them.  Reduce activities and keep your foot elevated when able to reduce swelling and discomfort. Do this until the infection gets better.  Wear sandals or go barefoot as much as possible while the infected area is sensitive.  See your caregiver for follow-up care in 2-3 days if the infection is not better. SEEK MEDICAL CARE IF:  Your toe is becoming more red, swollen or painful. MAKE SURE YOU:   Understand these instructions.  Will watch your condition.  Will get help right away if you are not doing well or get worse. Document Released: 08/15/2004 Document Revised: 09/30/2011 Document Reviewed: 07/04/2008 [facility] Patient Information 2015 [place], [place]. This information is not intended to replace advice given to you by your health care provider. Make sure you discuss any questions you have with your health care provider.

## 2015-04-16 NOTE — ED Notes (Signed)
Pt c/o rt big toe infection from a ingrown toe nail; states having pain to center of foot

## 2015-06-30 DIAGNOSIS — R300 Dysuria: Secondary | ICD-10-CM | POA: Insufficient documentation

## 2015-06-30 DIAGNOSIS — N390 Urinary tract infection, site not specified: Secondary | ICD-10-CM | POA: Insufficient documentation

## 2016-04-08 ENCOUNTER — Other Ambulatory Visit: Payer: Self-pay | Admitting: Obstetrics and Gynecology

## 2016-04-08 DIAGNOSIS — R030 Elevated blood-pressure reading, without diagnosis of hypertension: Secondary | ICD-10-CM | POA: Insufficient documentation

## 2016-04-11 LAB — CYTOLOGY - PAP

## 2016-10-02 DIAGNOSIS — R202 Paresthesia of skin: Secondary | ICD-10-CM | POA: Insufficient documentation

## 2017-07-22 DIAGNOSIS — K5120 Ulcerative (chronic) proctitis without complications: Secondary | ICD-10-CM

## 2017-07-22 HISTORY — DX: Ulcerative (chronic) proctitis without complications: K51.20

## 2017-10-24 ENCOUNTER — Encounter: Payer: Self-pay | Admitting: Family Medicine

## 2017-11-25 ENCOUNTER — Encounter: Payer: Self-pay | Admitting: Gastroenterology

## 2018-01-02 ENCOUNTER — Encounter: Payer: Self-pay | Admitting: Gastroenterology

## 2018-01-02 ENCOUNTER — Ambulatory Visit (INDEPENDENT_AMBULATORY_CARE_PROVIDER_SITE_OTHER): Payer: PRIVATE HEALTH INSURANCE | Admitting: Gastroenterology

## 2018-01-02 ENCOUNTER — Other Ambulatory Visit (INDEPENDENT_AMBULATORY_CARE_PROVIDER_SITE_OTHER): Payer: PRIVATE HEALTH INSURANCE

## 2018-01-02 VITALS — BP 122/84 | HR 84 | Ht 64.0 in | Wt 161.4 lb

## 2018-01-02 DIAGNOSIS — R197 Diarrhea, unspecified: Secondary | ICD-10-CM | POA: Diagnosis not present

## 2018-01-02 NOTE — Progress Notes (Signed)
Chief Complaint: Bloody diarrhea  Referring Provider:  Enid Skeens., MD      ASSESSMENT AND PLAN;   #1.  Rectal bleeding ( Differential diagnosis includes hemorrhoids, AVMs, colitis, polyps, rule out colonic neoplasms or IBD) #2.  Diarrhea -negative stool studies for GI pathogens but C. difficile was not performed.  Status post 2 trials of Cipro and Flagyl for 10 days each. - Check CBC, CMP, CRP and TSH - Stool for C. Diff. - Proceed with colonoscopy for further evaluation.  I discussed risks and benefits in detail.  She will be given a MiraLAX preparation.  She understands.  All the questions were answered.  This will be scheduled in coming days.  However, if the stool is positive for C. difficile, then that needs to be treated first.  She does understand.  HPI:    Loretta Boone is a 32 y.o. 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 Loose mushy bowel movements 6-7/day Given a trial of Flagyl and Cipro for 10 days in April with some relief. But then again started having diarrhea with blood mixed with the stool with mucus.  Occasional nocturnal symptoms.  She was given another course of Cipro and Flagyl in May without any relief.  This was associated with left-sided abdominal cramps Stopped all NSAIDs The cramps have gotten better. Stool studies were negative for Salmonella, Shigella, Yersinia and Campylobacter.  Positive for WBCs.  C. difficile was not done by the lab. Had some subjective fever and chills which have resolved. Has been advised by Dr. Wendie Agreste to get colonoscopy performed.   Past Medical History:  Diagnosis Date  . Chronic UTI   . GI bleed   . Hypertension   . Infectious colitis   . No pertinent past medical history     Past Surgical History:  Procedure  Laterality Date  . BREAST LUMPECTOMY  '09   left  . LAPAROSCOPY  02/26/2011   Procedure: LAPAROSCOPY OPERATIVE;  Surgeon: Arloa Koh;  Location: [facility];  Service: Gynecology;  Laterality: N/A;  . OVARIAN CYST REMOVAL  02/26/2011   Procedure: OVARIAN CYSTECTOMY;  Surgeon: Arloa Koh;  Location: [facility];  Service: Gynecology;  Laterality: Right;    Family History  Problem Relation Age of Onset  . Colon polyps Mother   . Diabetes Father   . Colon polyps Father   . Heart disease Maternal Grandfather   . 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 Smoking status: Never Smoker  . Smokeless tobacco: Never Used  Substance Use Topics  . Alcohol use: Yes    Comment: occasionally  . Drug use: No    Current Outpatient Medications  Medication Sig Dispense Refill  . cetirizine (ZYRTEC) 10 MG tablet Take 10 mg by mouth daily.       No current facility-administered medications for this visit.     Allergies  Allergen Reactions  . Minocycline Base Hives and Swelling  . Minocycline Swelling    Review of Systems:  Constitutional: Denies fever, chills, diaphoresis, appetite change and fatigue.  HEENT: Denies photophobia, eye pain, redness, hearing loss, ear pain, congestion, sore throat, rhinorrhea,  sneezing, mouth sores, neck pain, neck stiffness and tinnitus.   Respiratory: Denies SOB, DOE, cough, chest tightness,  and wheezing.   Cardiovascular: Denies chest pain, palpitations and leg swelling.  Genitourinary: Denies dysuria, urgency, frequency, hematuria, flank pain and difficulty urinating.  Musculoskeletal: Denies myalgias, back pain, joint swelling, arthralgias and gait problem.  Skin: No rash.  Neurological: Denies dizziness, seizures, syncope, weakness, light-headedness, numbness and headaches.  Hematological: Denies adenopathy. Easy bruising, personal or family bleeding history    Psychiatric/Behavioral: No anxiety or depression     Physical Exam:    Vitals:   01/02/18 1354  BP: 122/84  Pulse: 84   Filed Weights   01/02/18 1354  Weight: 161 lb 6 oz (73.2 kg)   Constitutional:  Well-developed, in no acute distress. Psychiatric: Normal mood and affect. Behavior is normal. HEENT: Pupils normal.  Conjunctivae are normal. No scleral icterus. Neck supple.  Cardiovascular: Normal rate, regular rhythm. No edema Pulmonary/chest: Effort normal and breath sounds normal. No wheezing, rales or rhonchi. Abdominal: Soft, nondistended. Nontender. Bowel sounds active throughout. There are no masses palpable. No hepatomegaly. Rectal: To be performed at the time of colonoscopy. Neurological: Alert and oriented to person place and time. Skin: Skin is warm and dry. No rashes noted. Examined in presence of patient's mother   Carmell Austria, MD 01/02/2018, 2:04 PM  Cc: Enid Skeens., MD

## 2018-01-02 NOTE — Patient Instructions (Signed)
If you are age 34 or older, your body mass index should be between 23-30. Your Body mass index is 27.7 kg/m. If this is out of the aforementioned range listed, please consider follow up with your Primary Care Provider.  If you are age 45 or younger, your body mass index should be between 19-25. Your Body mass index is 27.7 kg/m. If this is out of the aformentioned range listed, please consider follow up with your Primary Care Provider.   You have been scheduled for a colonoscopy. Please follow written instructions given to you at your visit today.  Please pick up your prep supplies at the pharmacy within the next 1-3 days. If you use inhalers (even only as needed), please bring them with you on the day of your procedure. Your physician has requested that you go to www.startemmi.com and enter the access code given to you at your visit today. This web site gives a general overview about your procedure. However, you should still follow specific instructions given to you by our office regarding your preparation for the procedure.  Please stop at the lab before you leave the office today.    Thank you,  Dr. Jackquline Denmark

## 2018-01-03 LAB — CBC WITH DIFFERENTIAL/PLATELET
Basophils Absolute: 27 cells/uL (ref 0–200)
Basophils Relative: 0.3 %
Eosinophils Absolute: 0 cells/uL — ABNORMAL LOW (ref 15–500)
Eosinophils Relative: 0 %
HCT: 43.4 % (ref 35.0–45.0)
Hemoglobin: 15.1 g/dL (ref 11.7–15.5)
Lymphs Abs: 2803 cells/uL (ref 850–3900)
MCH: 30.1 pg (ref 27.0–33.0)
MCHC: 34.8 g/dL (ref 32.0–36.0)
MCV: 86.6 fL (ref 80.0–100.0)
MPV: 12 fL (ref 7.5–12.5)
Monocytes Relative: 3.7 %
Neutro Abs: 5933 cells/uL (ref 1500–7800)
Neutrophils Relative %: 65.2 %
Platelets: 272 10*3/uL (ref 140–400)
RBC: 5.01 10*6/uL (ref 3.80–5.10)
RDW: 12.3 % (ref 11.0–15.0)
Total Lymphocyte: 30.8 %
WBC mixed population: 337 cells/uL (ref 200–950)
WBC: 9.1 10*3/uL (ref 3.8–10.8)

## 2018-01-03 LAB — TSH: TSH: 1.08 m[IU]/L

## 2018-01-03 LAB — COMPREHENSIVE METABOLIC PANEL
AG Ratio: 2 (calc) (ref 1.0–2.5)
ALT: 12 U/L (ref 6–29)
AST: 15 U/L (ref 10–30)
Albumin: 5.1 g/dL (ref 3.6–5.1)
Alkaline phosphatase (APISO): 78 U/L (ref 33–115)
BILIRUBIN TOTAL: 0.4 mg/dL (ref 0.2–1.2)
BUN: 7 mg/dL (ref 7–25)
CALCIUM: 9.9 mg/dL (ref 8.6–10.2)
CO2: 29 mmol/L (ref 20–32)
Chloride: 101 mmol/L (ref 98–110)
Creat: 0.68 mg/dL (ref 0.50–1.10)
Globulin: 2.6 g/dL (calc) (ref 1.9–3.7)
Glucose, Bld: 80 mg/dL (ref 65–99)
Potassium: 3.8 mmol/L (ref 3.5–5.3)
Sodium: 140 mmol/L (ref 135–146)
TOTAL PROTEIN: 7.7 g/dL (ref 6.1–8.1)

## 2018-01-03 LAB — C-REACTIVE PROTEIN: CRP: 2.7 mg/L (ref <8.0)

## 2018-01-06 ENCOUNTER — Telehealth: Payer: Self-pay | Admitting: Gastroenterology

## 2018-01-06 NOTE — Telephone Encounter (Signed)
I gave the lab results and also mailed her a copy of the lab.

## 2018-01-09 ENCOUNTER — Other Ambulatory Visit (INDEPENDENT_AMBULATORY_CARE_PROVIDER_SITE_OTHER): Payer: PRIVATE HEALTH INSURANCE

## 2018-01-09 DIAGNOSIS — R197 Diarrhea, unspecified: Secondary | ICD-10-CM

## 2018-01-10 LAB — CLOSTRIDIUM DIFFICILE TOXIN B, QUALITATIVE, REAL-TIME PCR: Toxigenic C. Difficile by PCR: NOT DETECTED

## 2018-01-16 ENCOUNTER — Encounter: Payer: Self-pay | Admitting: Gastroenterology

## 2018-01-28 ENCOUNTER — Encounter: Payer: Self-pay | Admitting: Gastroenterology

## 2018-01-28 ENCOUNTER — Other Ambulatory Visit: Payer: Self-pay

## 2018-01-28 ENCOUNTER — Ambulatory Visit (AMBULATORY_SURGERY_CENTER): Payer: PRIVATE HEALTH INSURANCE | Admitting: Gastroenterology

## 2018-01-28 VITALS — BP 132/83 | HR 78 | Temp 98.6°F | Resp 15 | Ht 64.0 in | Wt 161.0 lb

## 2018-01-28 DIAGNOSIS — K921 Melena: Secondary | ICD-10-CM

## 2018-01-28 DIAGNOSIS — R197 Diarrhea, unspecified: Secondary | ICD-10-CM

## 2018-01-28 DIAGNOSIS — K6289 Other specified diseases of anus and rectum: Secondary | ICD-10-CM | POA: Diagnosis not present

## 2018-01-28 HISTORY — PX: COLONOSCOPY WITH PROPOFOL: SHX5780

## 2018-01-28 MED ORDER — SODIUM CHLORIDE 0.9 % IV SOLN: 500.0000 mL | Freq: Once | INTRAVENOUS | Status: DC

## 2018-01-28 NOTE — Progress Notes (Signed)
Pt's states no medical or surgical changes since previsit or office visit. 

## 2018-01-28 NOTE — Progress Notes (Signed)
Report to RN, VSS, adequate respirations noted, no c/o pain or discomfort

## 2018-01-28 NOTE — Op Note (Signed)
[facility] Patient Name: Loretta Boone Procedure Date: 01/28/2018 8:04 AM MRN: [id number] Endoscopist: Jackquline Denmark , MD Age: 32 Referring MD:  Date of Birth: [birth year] Gender: Female Account #: [id number] Procedure:                Colonoscopy Indications:              Rectal bleeding Medicines:                Monitored Anesthesia Care Procedure:                Pre-Anesthesia Assessment:                           - Prior to the procedure, a History and Physical                            was performed, and patient medications and                            allergies were reviewed. The patient is competent.                            The risks and benefits of the procedure and the                            sedation options and risks were discussed with the                            patient. All questions were answered and informed                            consent was obtained. Patient identification and                            proposed procedure were verified by the physician                            in the procedure room. Mental Status Examination:                            alert and oriented. Prophylactic Antibiotics: The                            patient does not require prophylactic antibiotics.                            Prior Anticoagulants: The patient has taken no                            previous anticoagulant or antiplatelet agents. ASA                            Grade Assessment: I - A normal, healthy patient.  After reviewing the risks and benefits, the patient                            was deemed in satisfactory condition to undergo the                            procedure. The anesthesia plan was to use monitored                            anesthesia care (MAC). Immediately prior to                            administration of medications, the patient was                            re-assessed for adequacy to  receive sedatives. The                            heart rate, respiratory rate, oxygen saturations,                            blood pressure, adequacy of pulmonary ventilation,                            and response to care were monitored throughout the                            procedure. The physical status of the patient was                            re-assessed after the procedure.                           After obtaining informed consent, the colonoscope                            was passed under direct vision. Throughout the                            procedure, the patient's blood pressure, pulse, and                            oxygen saturations were monitored continuously. The                            Model PCF-H190DL [id number] [id number]) scope was introduced                            through the anus and advanced to the 4 cm into the                            ileum. The colonoscopy was performed without  difficulty. The patient tolerated the procedure                            well. The quality of the bowel preparation was                            excellent. Scope In: 8:07:44 AM Scope Out: 8:26:16 AM Scope Withdrawal Time: 0 hours 13 minutes 15 seconds  Total Procedure Duration: 0 hours 18 minutes 32 seconds  Findings:                 Localized mild continuous mucosal changes                            characterized by altered vascularity, congestion                            (edema), erosions, erythema and friability were                            found in the rectum up to 5 cm from the anal verge.                            Biopsies were taken with a cold forceps for                            histology. Verification of patient identification                            for the specimen was done. Estimated blood loss was                            minimal. Biopsies were taken with a cold forceps                            for histology from  throughout the colon                            (endoscopically normal) and from terminal ileum (                            endoscopically normal) and sent for pathology in                            separate container.                           The exam was otherwise without abnormality on                            direct and retroflexion views. Minimal intimal                            hemorrhoids were noted.  The perianal and digital rectal examinations were                            normal. No perianal fistulas or any evidence of                            Crohn's disease. Complications:            No immediate complications. Estimated Blood Loss:     Estimated blood loss was minimal. 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 (biopsied)                           - Otherwise normal colonoscopy to terminal ileum. Recommendation:           - Patient has a contact number available for                            emergencies. The signs and symptoms of potential                            delayed complications were discussed with the                            patient. Return to normal activities tomorrow.                            Written discharge instructions were provided to the                            patient.                           - Resume previous diet.                           - Continue present medications.                           - Await pathology results. D/d does include                            localized Crohn's disease.                           - Return to GI office after studies are complete. Jackquline Denmark, MD 01/28/2018 8:39:30 AM This report has been signed electronically.

## 2018-01-28 NOTE — Progress Notes (Signed)
Called to room to assist during endoscopic procedure.  Patient ID and intended procedure confirmed with present staff. Received instructions for my participation in the procedure from the performing physician.  

## 2018-01-28 NOTE — Patient Instructions (Signed)
Discharge instructions given. Biopsies taken. Resume previous medications. YOU HAD AN ENDOSCOPIC PROCEDURE TODAY AT [facility]:   Refer to the procedure report that was given to you for any specific questions about what was found during the examination.  If the procedure report does not answer your questions, please call your gastroenterologist to clarify.  If you requested that your care partner not be given the details of your procedure findings, then the procedure report has been included in a sealed envelope for you to review at your convenience later.  YOU SHOULD EXPECT: Some feelings of bloating in the abdomen. Passage of more gas than usual.  Walking can help get rid of the air that was put into your GI tract during the procedure and reduce the bloating. If you had a lower endoscopy (such as a colonoscopy or flexible sigmoidoscopy) you may notice spotting of blood in your stool or on the toilet paper. If you underwent a bowel prep for your procedure, you may not have a normal bowel movement for a few days.  Please Note:  You might notice some irritation and congestion in your nose or some drainage.  This is from the oxygen used during your procedure.  There is no need for concern and it should clear up in a day or so.  SYMPTOMS TO REPORT IMMEDIATELY:   Following lower endoscopy (colonoscopy or flexible sigmoidoscopy):  Excessive amounts of blood in the stool  Significant tenderness or worsening of abdominal pains  Swelling of the abdomen that is new, acute  Fever of 100F or higher   For urgent or emergent issues, a gastroenterologist can be reached at any hour by calling [phone].   DIET:  We do recommend a small meal at first, but then you may proceed to your regular diet.  Drink plenty of fluids but you should avoid alcoholic beverages for 24 hours.  ACTIVITY:  You should plan to take it easy for the rest of today and you should NOT DRIVE or use heavy  machinery until tomorrow (because of the sedation medicines used during the test).    FOLLOW UP: Our staff will call the number listed on your records the next business day following your procedure to check on you and address any questions or concerns that you may have regarding the information given to you following your procedure. If we do not reach you, we will leave a message.  However, if you are feeling well and you are not experiencing any problems, there is no need to return our call.  We will assume that you have returned to your regular daily activities without incident.  If any biopsies were taken you will be contacted by phone or by letter within the next 1-3 weeks.  Please call us at [phone] if you have not heard about the biopsies in 3 weeks.    SIGNATURES/CONFIDENTIALITY: You and/or your care partner have signed paperwork which will be entered into your electronic medical record.  These signatures attest to the fact that that the information above on your After Visit Summary has been reviewed and is understood.  Full responsibility of the confidentiality of this discharge information lies with you and/or your care-partner.

## 2018-01-29 ENCOUNTER — Telehealth: Payer: Self-pay

## 2018-01-29 NOTE — Telephone Encounter (Signed)
  Follow up Call-  Call back number 01/28/2018  Post procedure Call Back phone  # [phone]  Permission to leave phone message Yes  Some recent data might be hidden     Patient questions:  Do you have a fever, pain , or abdominal swelling? No. Pain Score  0 *  Have you tolerated food without any problems? Yes.    Have you been able to return to your normal activities? Yes.    Do you have any questions about your discharge instructions: Diet   No. Medications  No. Follow up visit  No.  Do you have questions or concerns about your Care? No.  Actions: * If pain score is 4 or above: No action needed, pain <4.

## 2018-02-03 ENCOUNTER — Telehealth: Payer: Self-pay | Admitting: Gastroenterology

## 2018-02-04 ENCOUNTER — Telehealth: Payer: Self-pay

## 2018-02-04 ENCOUNTER — Other Ambulatory Visit: Payer: Self-pay

## 2018-02-04 DIAGNOSIS — K6289 Other specified diseases of anus and rectum: Secondary | ICD-10-CM

## 2018-02-04 MED ORDER — MESALAMINE 1000 MG RE SUPP: RECTAL | 2 refills | Status: DC

## 2018-02-04 NOTE — Telephone Encounter (Signed)
I left a message for her asking her to call the office to schedule a follow-up with Dr Lyndel Safe in September.

## 2018-02-04 NOTE — Telephone Encounter (Signed)
I left a message on her voicemail and sent the prescription to her pharmacy.

## 2018-02-04 NOTE — Telephone Encounter (Signed)
I spoke with her and she is questioning what suppository you want her to start?  Please advise and I will send to her pharmacy.  Also, she will send her FMLA paperwork to [employer] per protocol, you do not fill out the paperwork but it will come to you for a signature.

## 2018-02-04 NOTE — Telephone Encounter (Signed)
Lets start mesalamine 1 g PR suppositories twice daily x 2 weeks, then can reduce it to once a day for maintenance until she comes back for follow-up appointment. Dx: Ulcerative proctitis

## 2018-02-04 NOTE — Telephone Encounter (Signed)
I spoke with the patient and let her know the name of the suppository.

## 2018-03-12 ENCOUNTER — Telehealth: Payer: Self-pay

## 2018-03-27 ENCOUNTER — Encounter: Payer: Self-pay | Admitting: Gastroenterology

## 2018-03-27 ENCOUNTER — Ambulatory Visit (INDEPENDENT_AMBULATORY_CARE_PROVIDER_SITE_OTHER): Payer: BLUE CROSS/BLUE SHIELD | Admitting: Gastroenterology

## 2018-03-27 ENCOUNTER — Encounter (INDEPENDENT_AMBULATORY_CARE_PROVIDER_SITE_OTHER): Payer: Self-pay

## 2018-03-27 VITALS — BP 112/72 | HR 88 | Ht 63.0 in | Wt 162.4 lb

## 2018-03-27 DIAGNOSIS — K5120 Ulcerative (chronic) proctitis without complications: Secondary | ICD-10-CM | POA: Diagnosis not present

## 2018-03-27 NOTE — Patient Instructions (Signed)
If you are age 34 or older, your body mass index should be between 23-30. Your Body mass index is 28.76 kg/m. If this is out of the aforementioned range listed, please consider follow up with your Primary Care Provider.  If you are age 47 or younger, your body mass index should be between 19-25. Your Body mass index is 28.76 kg/m. If this is out of the aformentioned range listed, please consider follow up with your Primary Care Provider.   Thank you,  Dr. Jackquline Denmark

## 2018-03-27 NOTE — Progress Notes (Signed)
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 Nl TI. D/d localized Crohn's disease, negative CT 02/17/2018 at [facility]) Plan: - Continue mesalamine supp 1g PR QHS as maintenance therapy. - Continue probiotics (culturrele and Keifer) - Continue metamucil for now. - Have discussed regarding Imuran and Biologics like Humira.  Since, she feels significantly better she would like to hold off on any other medications.  - Follow-up in 6 months, earlier in case of any problems.      HPI:    Chief Complaint:   Jakki Doughty is a 32 y.o. female  Feels better Has fatigue (she will talk to Dr. Wendie Agreste regarding Wellbutrin) But going to school Doing very well on mesalamine suppositories 1 g p.o. Nightly. No further bleeding No diarrhea Recently had a urinary tract infection and was treated with Cipro followed by Levaquin.  Underwent CT scan of the abdomen and pelvis at [facility] on 02/17/2018 which was unremarkable. Has appointment with urology   Past Medical History:  Diagnosis Date  . Chronic UTI   . GI bleed   . Hypertension   . Infectious colitis   . No pertinent past medical history     Current Outpatient Medications  Medication Sig Dispense Refill  . cetirizine (ZYRTEC) 10 MG tablet Take 10 mg by mouth daily.      . Lactobacillus-Inulin (CULTURELLE DIGESTIVE HEALTH PO) Take 1 tablet by mouth daily.    . Levonorgestrel (KYLEENA IU) by Intrauterine route.    Marland Kitchen Lifitegrast (XIIDRA) 5 % SOLN Apply 1 drop to eye daily.    . mesalamine (CANASA) 1000 MG suppository 1 Suppository PR twice daily for 2 weeks and then daily. 42 suppository 2  . Psyllium (METAMUCIL FIBER PO) Take 1 Package by mouth daily.     Current Facility-Administered Medications  Medication Dose Route Frequency Provider Last Rate Last Dose  . 0.9 %  sodium chloride infusion  500 mL Intravenous Once Jackquline Denmark, MD        Past Surgical  History:  Procedure Laterality Date  . BREAST LUMPECTOMY  '09   left  . LAPAROSCOPY  02/26/2011   Procedure: LAPAROSCOPY OPERATIVE;  Surgeon: Arloa Koh;  Location: [place] [facility];  Service: Gynecology;  Laterality: N/A;  . OVARIAN CYST REMOVAL  02/26/2011   Procedure: OVARIAN CYSTECTOMY;  Surgeon: Arloa Koh;  Location: [facility];  Service: Gynecology;  Laterality: Right;    Family History  Problem Relation Age of Onset  . Colon polyps Mother   . Diabetes Father   . Colon polyps Father   . Heart disease Maternal Grandfather   . Colon cancer Maternal Uncle 38       passed away at age 81    Social History   Tobacco Use  . Smoking status: Never Smoker  . Smokeless tobacco: Never Used  Substance Use Topics  . Alcohol use: Yes    Comment: occasionally  . Drug use: No    Allergies  Allergen Reactions  . Minocycline Base Hives and Swelling  . Minocycline Swelling     Review of Systems: All systems reviewed and negative except where noted in HPI.    Physical Exam:     BP 112/72   Pulse 88   Ht 5' 3"  (1.6 m)   Wt 162 lb 6 oz (73.7 kg)   BMI 28.76 kg/m  @WEIGHTLAST3 @ GENERAL:  Alert, oriented, cooperative, not in acute  distress. PSYCH: :Pleasant, normal mood and affect. HEENT:  conjunctiva pink, mucous membranes moist. No jaundice. ABDOMEN: Inspection: No visible peristalsis, no abnormal pulsations, skin normal.  Palpation/percussion: Soft, nontender, nondistended, no rigidity, no abnormal dullness to percussion, no hepatosplenomegaly and no palpable abdominal masses.  Auscultation: Normal bowel sounds, no abdominal bruits. Rectal exam: Deferred SKIN:  turgor, no lesions seen. Musculoskeletal:  Normal muscle tone, normal strength. NEURO: Alert and oriented x 3, no focal neurologic deficits. She had several questions.  I spent 15 minutes of face-to-face time with the patient. Greater than 50% of the time was spent counseling and coordinating care.    Miranda Frese,MD  03/27/2018, 9:26 AM   CC [place], Marshall Cork., MD

## 2018-05-08 ENCOUNTER — Other Ambulatory Visit: Payer: Self-pay | Admitting: Obstetrics and Gynecology

## 2018-05-08 DIAGNOSIS — N644 Mastodynia: Secondary | ICD-10-CM

## 2018-05-14 ENCOUNTER — Ambulatory Visit
Admission: RE | Admit: 2018-05-14 | Discharge: 2018-05-14 | Disposition: A | Discharge status: Home / Self Care | Payer: BLUE CROSS/BLUE SHIELD | Source: Ambulatory Visit | Attending: Obstetrics and Gynecology | Admitting: Obstetrics and Gynecology

## 2018-05-14 DIAGNOSIS — N644 Mastodynia: Secondary | ICD-10-CM

## 2018-05-14 IMAGING — US ULTRASOUND RIGHT BREAST LIMITED
1 series · 5 of 5 positions shown · non-contrast
Comparison: Previous left breast ultrasound reports.

CLINICAL DATA: Intermittent sharp pains in the right nipple for the
past 3.5 weeks. The patient reports that the pains feel like an
electrical shock and last from approximately 30 seconds to
minutes. Sometimes the pains begin in the lateral aspect of the
breast and extend toward the nipple areolar complex. She feels a
small, palpable mass-like area in the lower inner retroareolar
region of the right breast. This was also felt on recent physical
examination. No family history of breast cancer. She had a left
breast fibroadenoma excised in the past.

EXAM:
DIGITAL DIAGNOSTIC BILATERAL MAMMOGRAM WITH CAD AND TOMO
ULTRASOUND RIGHT BREAST

[Series 1: ultrasound right breast limited · 0.07mm/px · 5 of 5 slices shown]
[im 1/5]
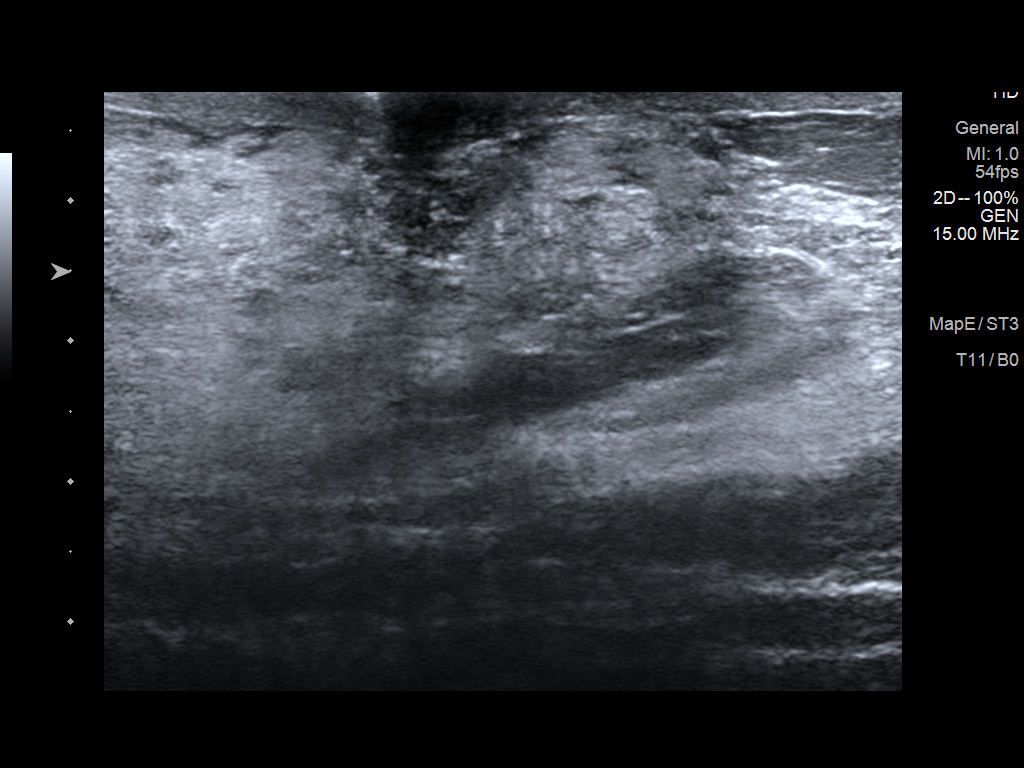
[im 2/5]
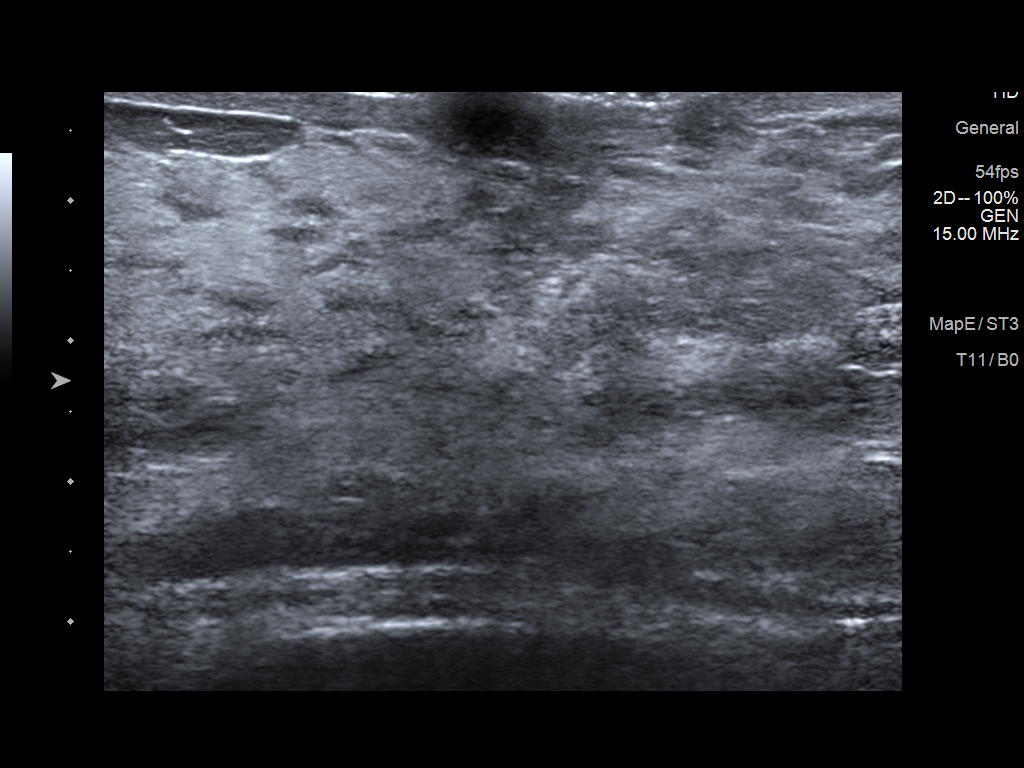
[im 3/5]
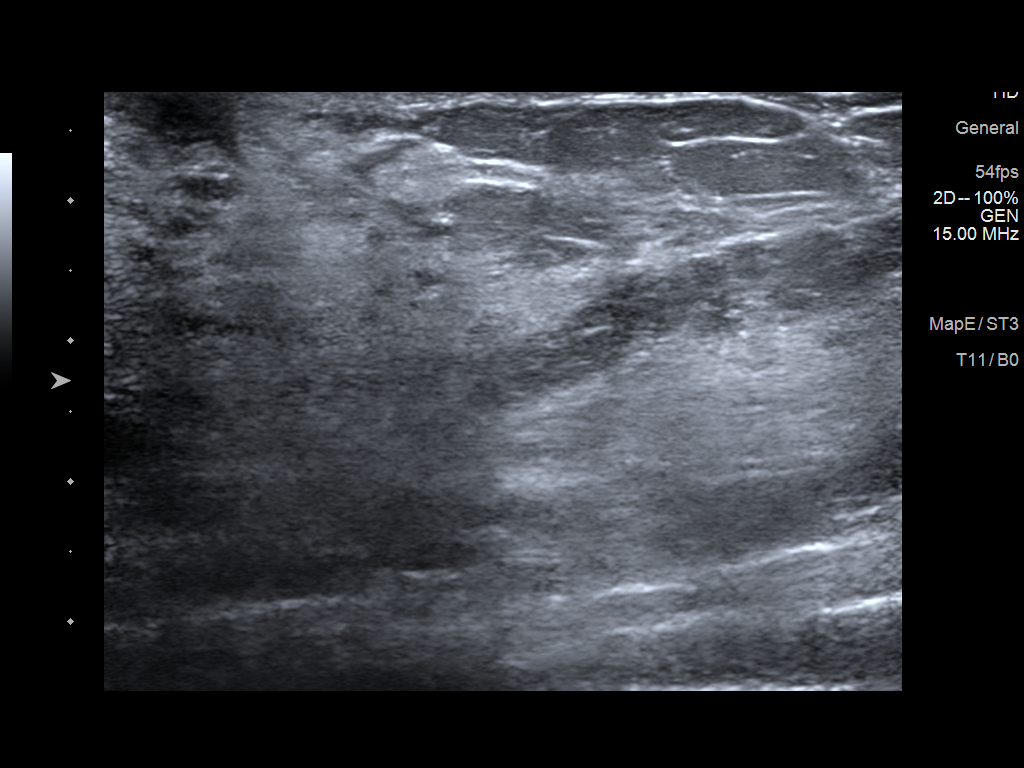
[im 4/5]
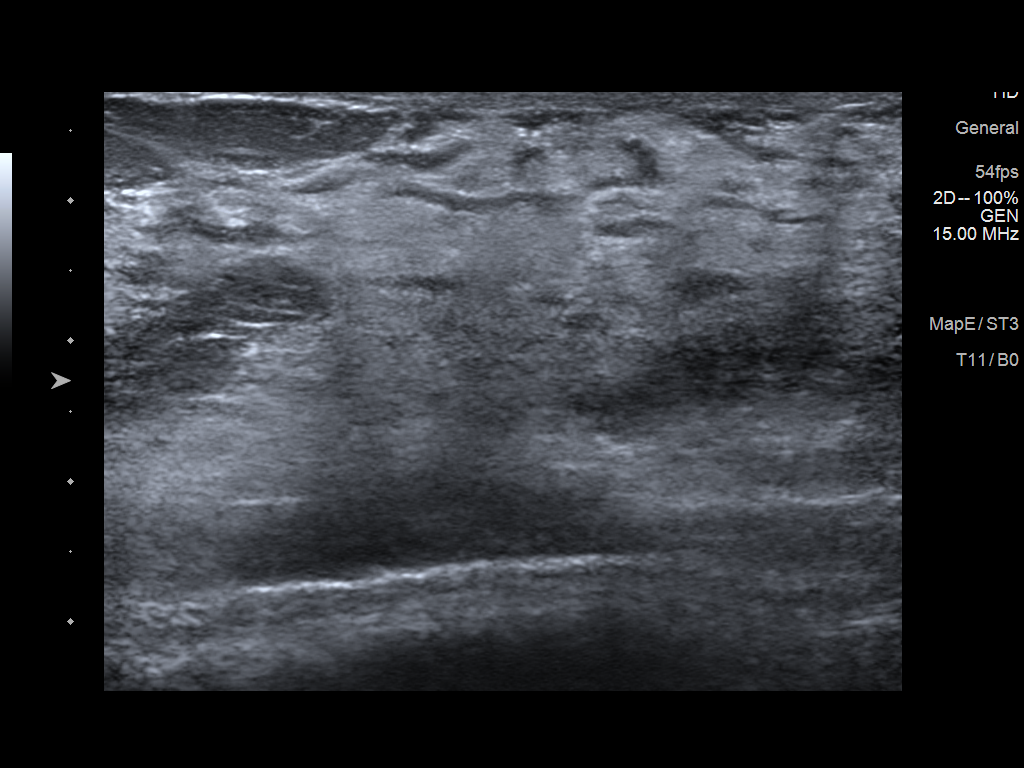
[im 5/5]
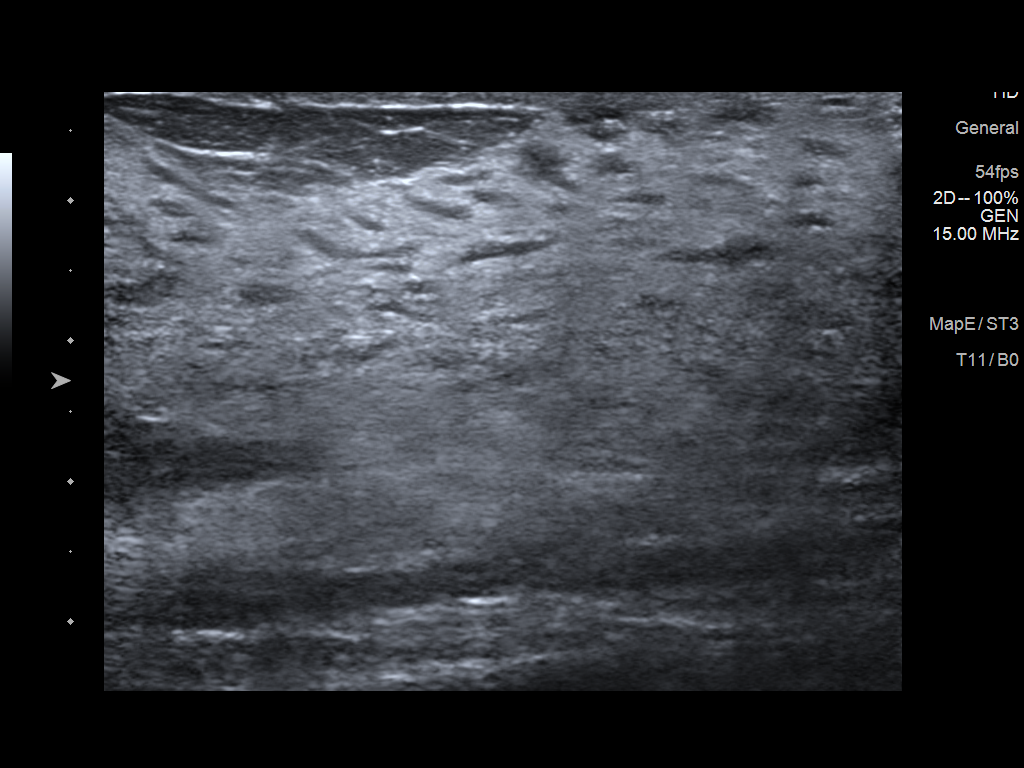

[5 of 5 positions shown; findings below may reference images not displayed]

ACR Breast Density Category c: The breast tissue is heterogeneously
dense, which may obscure small masses.
FINDINGS: Mammographically normal appearing breasts with no findings
suspicious for malignancy in either breast.

Mammographic images were processed with CAD.

On physical exam, there is an approximately 1 cm rounded area of
palpable soft tissue thickening deep in the lower inner retroareolar
right breast. No other abnormalities are palpable.

Targeted ultrasound is performed, showing normal appearing breast
tissue throughout the retroareolar and periareolar regions of the
right breast, including dense glandular tissue. No cystic or solid
masses were seen.
IMPRESSION: No evidence of malignancy.

RECOMMENDATION:
Annual screening mammography beginning at age 40.

I have discussed the findings and recommendations with the patient.
Results were also provided in writing at the conclusion of the
visit. If applicable, a reminder letter will be sent to the patient
regarding the next appointment.

BI-RADS CATEGORY  1: Negative.

## 2018-10-13 ENCOUNTER — Telehealth: Payer: Self-pay | Admitting: Gastroenterology

## 2018-10-13 NOTE — Telephone Encounter (Signed)
Please advise 

## 2018-10-13 NOTE — Telephone Encounter (Signed)
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

## 2018-10-13 NOTE — Telephone Encounter (Signed)
Ideally she should not She is pregnant and will be exposed to Covid 19 which puts her at a higher risk. For the flareup of ulcerative proctitis-she needs to keep on using mesalamine suppositories as prescribed during the last office visit.  If she wants more, please send her prescription for 30, 11 refills.

## 2018-10-14 NOTE — Telephone Encounter (Signed)
I have spoken to patient and she said she is currently out of work using her FMLA. Patient voiced understanding of all directions given.

## 2018-10-26 ENCOUNTER — Telehealth (INDEPENDENT_AMBULATORY_CARE_PROVIDER_SITE_OTHER): Payer: BLUE CROSS/BLUE SHIELD | Admitting: Gastroenterology

## 2018-10-26 ENCOUNTER — Other Ambulatory Visit: Payer: Self-pay

## 2018-10-26 ENCOUNTER — Encounter: Payer: Self-pay | Admitting: Gastroenterology

## 2018-10-26 VITALS — Ht 63.0 in | Wt 168.0 lb

## 2018-10-26 DIAGNOSIS — K51018 Ulcerative (chronic) pancolitis with other complication: Secondary | ICD-10-CM | POA: Diagnosis not present

## 2018-10-26 NOTE — Progress Notes (Signed)
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 Nl TI. D/d localized Crohn's disease, negative CT 02/17/2018 at [facility])  Plan: - Increase mesalamine supp 1g PR Bid. - Stop metamucil for now. - Check CBC, CMP, CRP, sed rate (she has appointment with the OB this Thursday 4/9.  she would like to get it done from there). - Stool for GI pathogen and WBCs. - Follow-up in 3 months, earlier in case of any problems.  She is to call us and let us know how she is doing next week.      HPI:    Chief Complaint:   Loretta Boone is a 33 y.o. 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  No melena or hematochezia.  No fever or chills.  No history suggestive of tenesmus.  Has been doing very well on mesalamine suppositories 1 g p.o. Nightly previously.   No further UTIs.  Seen by urology recently, had negative UA.    Past Medical History:  Diagnosis Date  . Chronic UTI   . GI bleed   . Hypertension   . Infectious colitis   . No pertinent past medical history     Current Outpatient Medications  Medication Sig Dispense Refill  . buPROPion (WELLBUTRIN XL) 300 MG 24 hr tablet Take 300 mg by mouth daily.    . cetirizine (ZYRTEC) 10 MG tablet Take 10 mg by mouth daily.      . Lactobacillus-Inulin (CULTURELLE DIGESTIVE HEALTH PO) Take 1 tablet by mouth daily.    . mesalamine (CANASA) 1000 MG suppository 1 Suppository PR twice daily for 2 weeks and then daily. 42 suppository 2  . Prenatal Vit-Fe Fumarate-FA (MULTIVITAMIN-PRENATAL) 27-0.8 MG TABS tablet Take 1 tablet by mouth daily at 12 noon.    . Psyllium (METAMUCIL FIBER PO) Take 1 Package by mouth daily.     Current Facility-Administered Medications  Medication Dose Route Frequency Provider Last Rate Last Dose  . 0.9 %  sodium chloride infusion  500 mL Intravenous Once  Jackquline Denmark, MD        Past Surgical History:  Procedure Laterality Date  . BREAST EXCISIONAL BIOPSY Left   . LAPAROSCOPY  02/26/2011   Procedure: LAPAROSCOPY OPERATIVE;  Surgeon: Arloa Koh;  Location: [place] [facility];  Service: Gynecology;  Laterality: N/A;  . OVARIAN CYST REMOVAL  02/26/2011   Procedure: OVARIAN CYSTECTOMY;  Surgeon: Arloa Koh;  Location: [place] [facility];  Service: Gynecology;  Laterality: Right;    Family History  Problem Relation Age of Onset  . Colon polyps Mother   . Diabetes Father   . Colon polyps Father   . Heart disease Maternal Grandfather   . Colon cancer Maternal Uncle 76       passed away at age 9  . Breast cancer Neg Hx     Social History   Tobacco Use  . Smoking status: Never Smoker  . Smokeless tobacco: Never Used  Substance Use Topics  . Alcohol use: Yes    Comment: occasionally  . Drug use: No    Allergies  Allergen Reactions  . Minocycline Base Hives and Swelling  . Minocycline Swelling     Review of Systems: All systems reviewed and negative except where noted in HPI.    Physical Exam:    Not examined since  it was a tele-visit   This service was provided via telemedicine.  The patient was located at home.  The provider was located in office.  The patient did consent to this telephone visit and is aware of possible charges through their insurance for this visit.  Time spent on call and coordination of care: 25 min     Loretta Bodin,MD 10/26/2018, 1:14 PM   CC Boone, Loretta Cork., MD

## 2018-10-26 NOTE — Patient Instructions (Signed)
-   Increase mesalamine supp 1g PR Bid. - Stop metamucil for now. - Check CBC, CMP, CRP, sed rate (she has appointment with the OB/GYN this Thursday 4/9.  she would like to get it done from there) - Stool for GI pathogen and WBCs. - Follow-up in 3 months, earlier in case of any problems.  She is to call us and let us know how she is doing next week.

## 2018-10-29 ENCOUNTER — Encounter: Payer: Self-pay | Admitting: Obstetrics and Gynecology

## 2018-11-03 ENCOUNTER — Telehealth: Payer: Self-pay | Admitting: Gastroenterology

## 2018-11-03 DIAGNOSIS — K51018 Ulcerative (chronic) pancolitis with other complication: Secondary | ICD-10-CM

## 2018-11-03 NOTE — Telephone Encounter (Signed)
Left message on machine to call back  

## 2018-11-03 NOTE — Telephone Encounter (Signed)
Pt wants to update [place] on how she was doing after the increase of mesalamine (CANASA). Patient said she is still having diarrhea.  Patient had a phone visit last week on 10-26-18

## 2018-11-05 NOTE — Telephone Encounter (Signed)
Patient reports that she is continuing to have episodes of diarrhea.  Not as frequently.  She is unable to do the stool studies.  She will try and come for that next week to perform the stool studies.  Her GYN was faxing the results of her blood work

## 2018-11-09 NOTE — Telephone Encounter (Signed)
Have reviewed the labs 10/29/2018: -Elevated CRP 17 mg/L, normal sed rate- 4, normal CMP, CBC shows elevated WBC count at 11.5 with a slight left shift.  Normal hemoglobin 13.9  Plan: -If possible, lets get the stool studies to make sure that she does not have C. Difficile as per last note. -Apriso 0.355m 4/day. Give cards/coupons.  One of the safest ones during pregnancy.  Other medications have more toxicity like budesonide/steroids etc. -Continue mesalamine suppositories 1g po bid -Please let u[place]oreaknow in 1 week.

## 2018-11-10 MED ORDER — MESALAMINE ER 0.375 G PO CP24: 1500.0000 mg | ORAL_CAPSULE | Freq: Every day | ORAL | 6 refills | Status: DC

## 2018-11-10 NOTE — Telephone Encounter (Signed)
Patient notified She is no longer having liquid stool.  She will turn in the studies if she is having loose stools again rx sent Coupon mailed to the patient  She understands to call with an update next week

## 2018-11-27 LAB — OB RESULTS CONSOLE ABO/RH: RH Type: POSITIVE

## 2018-11-27 LAB — OB RESULTS CONSOLE HEPATITIS B SURFACE ANTIGEN: Hepatitis B Surface Ag: NEGATIVE

## 2018-11-27 LAB — OB RESULTS CONSOLE RUBELLA ANTIBODY, IGM: Rubella: IMMUNE

## 2018-11-27 LAB — OB RESULTS CONSOLE GC/CHLAMYDIA
Chlamydia: NEGATIVE
Gonorrhea: NEGATIVE

## 2018-11-27 LAB — OB RESULTS CONSOLE ANTIBODY SCREEN: Antibody Screen: NEGATIVE

## 2018-11-27 LAB — OB RESULTS CONSOLE HIV ANTIBODY (ROUTINE TESTING): HIV: NONREACTIVE

## 2018-11-27 LAB — OB RESULTS CONSOLE RPR: RPR: NONREACTIVE

## 2019-03-17 ENCOUNTER — Encounter: Payer: Self-pay | Admitting: Registered"

## 2019-03-17 ENCOUNTER — Encounter: Payer: BC Managed Care – PPO | Attending: Obstetrics and Gynecology | Admitting: Registered"

## 2019-03-17 ENCOUNTER — Other Ambulatory Visit: Payer: Self-pay

## 2019-03-17 DIAGNOSIS — O99810 Abnormal glucose complicating pregnancy: Secondary | ICD-10-CM | POA: Diagnosis present

## 2019-03-17 NOTE — Progress Notes (Signed)
Patient was seen on 03/17/2019 for Gestational Diabetes self-management class at the [facility]. The following learning objectives were met by the patient during this course:   States the definition of Gestational Diabetes  States why dietary management is important in controlling blood glucose  Describes the effects each nutrient has on blood glucose levels  Demonstrates ability to create a balanced meal plan  Demonstrates carbohydrate counting   States when to check blood glucose levels  Demonstrates proper blood glucose monitoring techniques  States the effect of stress and exercise on blood glucose levels  States the importance of limiting caffeine and abstaining from alcohol and smoking  Blood glucose monitor given: Contour Next Lot # [id number] Exp: 05/22/19 Blood glucose reading: 81 mg/dL  Patient instructed to monitor glucose levels: FBS: 60 - <95; 1 hour: <140; 2 hour: <120  Patient received handouts:  Nutrition Diabetes and Pregnancy, including carb counting list  Patient will be seen for follow-up as needed.

## 2019-05-14 ENCOUNTER — Other Ambulatory Visit: Payer: Self-pay

## 2019-05-14 ENCOUNTER — Encounter (HOSPITAL_COMMUNITY): Payer: Self-pay

## 2019-05-14 ENCOUNTER — Observation Stay (HOSPITAL_COMMUNITY)
Admission: AD | Admit: 2019-05-14 | Discharge: 2019-05-15 | Disposition: A | Discharge status: Home / Self Care | Payer: BC Managed Care – PPO | Attending: Obstetrics | Admitting: Obstetrics

## 2019-05-14 DIAGNOSIS — O10913 Unspecified pre-existing hypertension complicating pregnancy, third trimester: Secondary | ICD-10-CM | POA: Insufficient documentation

## 2019-05-14 DIAGNOSIS — O24414 Gestational diabetes mellitus in pregnancy, insulin controlled: Secondary | ICD-10-CM | POA: Diagnosis not present

## 2019-05-14 DIAGNOSIS — O368130 Decreased fetal movements, third trimester, not applicable or unspecified: Secondary | ICD-10-CM | POA: Diagnosis present

## 2019-05-14 DIAGNOSIS — O368190 Decreased fetal movements, unspecified trimester, not applicable or unspecified: Secondary | ICD-10-CM

## 2019-05-14 DIAGNOSIS — Z79899 Other long term (current) drug therapy: Secondary | ICD-10-CM | POA: Insufficient documentation

## 2019-05-14 DIAGNOSIS — O26893 Other specified pregnancy related conditions, third trimester: Secondary | ICD-10-CM | POA: Diagnosis not present

## 2019-05-14 DIAGNOSIS — Z8249 Family history of ischemic heart disease and other diseases of the circulatory system: Secondary | ICD-10-CM | POA: Diagnosis not present

## 2019-05-14 DIAGNOSIS — Z888 Allergy status to other drugs, medicaments and biological substances status: Secondary | ICD-10-CM | POA: Insufficient documentation

## 2019-05-14 DIAGNOSIS — K5190 Ulcerative colitis, unspecified, without complications: Secondary | ICD-10-CM | POA: Insufficient documentation

## 2019-05-14 DIAGNOSIS — Z833 Family history of diabetes mellitus: Secondary | ICD-10-CM | POA: Diagnosis not present

## 2019-05-14 DIAGNOSIS — Z20828 Contact with and (suspected) exposure to other viral communicable diseases: Secondary | ICD-10-CM | POA: Diagnosis not present

## 2019-05-14 DIAGNOSIS — O321XX0 Maternal care for breech presentation, not applicable or unspecified: Secondary | ICD-10-CM | POA: Diagnosis not present

## 2019-05-14 DIAGNOSIS — Z9289 Personal history of other medical treatment: Secondary | ICD-10-CM

## 2019-05-14 DIAGNOSIS — Z3A36 36 weeks gestation of pregnancy: Secondary | ICD-10-CM | POA: Insufficient documentation

## 2019-05-14 HISTORY — DX: Decreased fetal movements, unspecified trimester, not applicable or unspecified: O36.8190

## 2019-05-14 HISTORY — DX: Ulcerative (chronic) pancolitis without complications: K51.00

## 2019-05-14 LAB — TYPE AND SCREEN
ABO/RH(D): O POS
Antibody Screen: NEGATIVE

## 2019-05-14 LAB — COMPREHENSIVE METABOLIC PANEL
ALT: 16 U/L (ref 0–44)
AST: 20 U/L (ref 15–41)
Albumin: 2.7 g/dL — ABNORMAL LOW (ref 3.5–5.0)
Alkaline Phosphatase: 160 U/L — ABNORMAL HIGH (ref 38–126)
Anion gap: 11 (ref 5–15)
BUN: 6 mg/dL (ref 6–20)
CO2: 20 mmol/L — ABNORMAL LOW (ref 22–32)
Calcium: 8.9 mg/dL (ref 8.9–10.3)
Chloride: 105 mmol/L (ref 98–111)
Creatinine, Ser: 0.63 mg/dL (ref 0.44–1.00)
GFR calc Af Amer: >60 mL/min (ref >60)
GFR calc non Af Amer: >60 mL/min (ref >60)
Glucose, Bld: 71 mg/dL (ref 70–99)
Potassium: 4 mmol/L (ref 3.5–5.1)
Sodium: 136 mmol/L (ref 135–145)
Total Bilirubin: 0.5 mg/dL (ref 0.3–1.2)
Total Protein: 6 g/dL — ABNORMAL LOW (ref 6.5–8.1)

## 2019-05-14 LAB — CBC
HCT: 40.4 % (ref 36.0–46.0)
Hemoglobin: 13.7 g/dL (ref 12.0–15.0)
MCH: 29.8 pg (ref 26.0–34.0)
MCHC: 33.9 g/dL (ref 30.0–36.0)
MCV: 87.8 fL (ref 80.0–100.0)
Platelets: 187 10*3/uL (ref 150–400)
RBC: 4.6 MIL/uL (ref 3.87–5.11)
RDW: 13.3 % (ref 11.5–15.5)
WBC: 10.7 10*3/uL — ABNORMAL HIGH (ref 4.0–10.5)
nRBC: 0 % (ref 0.0–0.2)

## 2019-05-14 LAB — GLUCOSE, CAPILLARY
Glucose-Capillary: 95 mg/dL (ref 70–99)
Glucose-Capillary: 89 mg/dL (ref 70–99)

## 2019-05-14 LAB — SARS CORONAVIRUS 2 BY RT PCR (HOSPITAL ORDER, PERFORMED IN ~~LOC~~ HOSPITAL LAB): SARS Coronavirus 2: NEGATIVE

## 2019-05-14 MED ORDER — GLYBURIDE 2.5 MG PO TABS
2.5000 mg | ORAL_TABLET | Freq: Two times a day (BID) | ORAL | Status: DC
  Filled 2019-05-14: qty 1

## 2019-05-14 MED ORDER — ACETAMINOPHEN 325 MG PO TABS: 650.0000 mg | ORAL_TABLET | ORAL | Status: DC | PRN

## 2019-05-14 MED ORDER — MESALAMINE ER 0.375 G PO CP24
1.5000 g | ORAL_CAPSULE | Freq: Every day | ORAL | Status: DC
  Filled 2019-05-14: qty 4

## 2019-05-14 MED ORDER — GLYBURIDE 2.5 MG PO TABS
2.5000 mg | ORAL_TABLET | Freq: Once | ORAL | Status: AC
  Administered 2019-05-14: 14:00:00 2.5 mg via ORAL

## 2019-05-14 MED ORDER — BUPROPION HCL ER (XL) 300 MG PO TB24
300.0000 mg | ORAL_TABLET | Freq: Every day | ORAL | Status: DC
  Administered 2019-05-14: 300 mg via ORAL
  Filled 2019-05-14 (×2): qty 1

## 2019-05-14 MED ORDER — PRENATAL MULTIVITAMIN CH
1.0000 | ORAL_TABLET | Freq: Every day | ORAL | Status: DC
  Filled 2019-05-14: qty 1

## 2019-05-14 MED ORDER — BUPROPION HCL ER (XL) 300 MG PO TB24: 300.0000 mg | ORAL_TABLET | Freq: Every day | ORAL | Status: DC

## 2019-05-14 MED ORDER — DOCUSATE SODIUM 100 MG PO CAPS
100.0000 mg | ORAL_CAPSULE | Freq: Every day | ORAL | Status: DC
  Filled 2019-05-14: qty 1

## 2019-05-14 MED ORDER — CALCIUM CARBONATE ANTACID 500 MG PO CHEW: 2.0000 | CHEWABLE_TABLET | ORAL | Status: DC | PRN

## 2019-05-14 NOTE — MAU Note (Signed)
Pt sent from office for for Decreased Fetal Movement. She also has CHTN and GDM.  She has weekly BPPs. Is also monitoring for oliog. Was 7.2 today. Today has only felt baby move once today. Has been spotting for several months, says it's just been monitored.  Denies LOF.

## 2019-05-14 NOTE — MAU Provider Note (Signed)
History     CSN: [id number]  Arrival date and time: 05/14/19 1108   None     Chief Complaint  Patient presents with  . Decreased Fetal Movement   HPI  Ms.Delsy Etzkorn is a 33 y.o. female G11P0@ 73w4dhere with decreased fetal movement. She was seen in the office today for a BPP and reports it was 4/10. She has decreased fluid with this pregnancy which has been monitored weekly. She felt the baby move 1x today. No leaking of fluid. No pain. She was sent to the office for fetal monitoring.   OB History    Gravida  1   Para      Term      Preterm      AB      Living        SAB      TAB      Ectopic      Multiple      Live Births              Past Medical History:  Diagnosis Date  . Chronic UTI   . GI bleed   . Hypertension   . Infectious colitis   . No pertinent past medical history   . Ulcerative (chronic) enterocolitis ([place]     Past Surgical History:  Procedure Laterality Date  . BREAST EXCISIONAL BIOPSY Left   . LAPAROSCOPY  02/26/2011   Procedure: LAPAROSCOPY OPERATIVE;  Surgeon: BArloa Koh  [facility];  Service: Gynecology;  Laterality: N/A;  . OVARIAN CYST REMOVAL  02/26/2011   Procedure: OVARIAN CYSTECTOMY;  Surgeon: BArloa Koh  [facility];  Service: Gynecology;  Laterality: Right;    Family History  Problem Relation Age of Onset  . Colon polyps Mother   . Hypertension Mother   . Diabetes Father   . Colon polyps Father   . Hypertension Father   . Heart disease Maternal Grandfather   . Colon cancer Maternal Uncle 562      passed away at age 35 . Breast cancer Neg Hx     Social History   Tobacco Use  . Smoking status: Never Smoker  . Smokeless tobacco: Never Used  Substance Use Topics  . Alcohol use: Yes    Comment: occasionally  . Drug use: No    Allergies:  Allergies  Allergen Reactions  . Minocycline Base Hives and Swelling  . Minocycline Swelling    Facility-Administered Medications  Prior to Admission  Medication Dose Route Frequency Provider Last Rate Last Dose  . 0.9 %  sodium chloride infusion  500 mL Intravenous Once GJackquline Denmark MD       Medications Prior to Admission  Medication Sig Dispense Refill Last Dose  . buPROPion (WELLBUTRIN XL) 300 MG 24 hr tablet Take 300 mg by mouth daily.   05/13/2019 at 2100  . cetirizine (ZYRTEC) 10 MG tablet Take 10 mg by mouth daily.     05/13/2019 at 2100  . Lactobacillus-Inulin (CULTURELLE DIGESTIVE HEALTH PO) Take 1 tablet by mouth daily.   05/13/2019 at 2100  . mesalamine (APRISO) 0.375 g 24 hr capsule Take 4 capsules (1.5 g total) by mouth daily. 120 capsule 6 05/13/2019 at 0700  . Prenatal Vit-Fe Fumarate-FA (MULTIVITAMIN-PRENATAL) 27-0.8 MG TABS tablet Take 1 tablet by mouth daily at 12 noon.   05/13/2019 at 2100  . mesalamine (CANASA) 1000 MG suppository 1 Suppository PR twice daily for 2 weeks and then daily. [place]  suppository 2 More than a month at Unknown time  . Psyllium (METAMUCIL FIBER PO) Take 1 Package by mouth daily.      No results found for this or any previous visit (from the past 48 hour(s)).  Review of Systems  Constitutional: Negative for fever.  Gastrointestinal: Negative for abdominal pain.   Physical Exam   Blood pressure 127/88, pulse 90, resp. rate 18, height 5' 3"  (1.6 m), weight 82.2 kg, SpO2 100 %.  Physical Exam  Constitutional: She is oriented to person, place, and time. She appears well-developed and well-nourished. No distress.  HENT:  Head: Normocephalic.  Respiratory: Effort normal.  Musculoskeletal: Normal range of motion.  Neurological: She is alert and oriented to person, place, and time.  Skin: Skin is warm. She is not diaphoretic.  Psychiatric: Her behavior is normal.   Fetal Tracing: Baseline: 135 bpm Variability: Moderate  Accelerations: 15x15 Decelerations: None Toco: Irregular pattern   MAU Course  Procedures  None  MDM  Discussed fetal tracing with Dr. Carlis Abbott along  with BP. Dr. Carlis Abbott to admit patient for extended monitoring and repeat BPP later today. Patient is aware and agreeable to the plan of care.   Assessment and Plan   A:  1. Decreased fetal movement affecting management of pregnancy, antepartum, single or unspecified fetus     P:  Admit to high risk ob Continuous monitoring.    Lezlie Lye, NP 05/14/2019 12:23 PM

## 2019-05-14 NOTE — H&P (Signed)
33 y.o. G1P0 @ 34w4dpresents from the office with a BPP of 4/8 (-2 tone, -2 movement).  She had been undergoing routine antenatal testing for GDMA2 on glyburide.  She also reports decreased fetal movment today.  On [place] fluid was low normal--AFI 7.2 cm and DVP 2.1cm.   Pregnancy c/b: 1. Ulcerative colitis: on mesalamine 2. GDMA2: on glyburide 2.541mbid.  Growth on 10/9:  EFW 31.5% 5#0, BPP 8/8 3. AMA 4.  Malpresentation: frank breech 5. Chronic hypertension: possible diagnosis of CHTN in 2017.  Was on metoprolol, but no in past year.  BPs have been normal throughout this pregnancy  Past Medical History:  Diagnosis Date  . Chronic UTI   . GI bleed   . Hypertension   . Infectious colitis   . No pertinent past medical history   . Ulcerative (chronic) enterocolitis ([place]    Past Surgical History:  Procedure Laterality Date  . BREAST EXCISIONAL BIOPSY Left   . LAPAROSCOPY  02/26/2011   Procedure: LAPAROSCOPY OPERATIVE;  Surgeon: BrArloa Koh [facility];  Service: Gynecology;  Laterality: N/A;  . OVARIAN CYST REMOVAL  02/26/2011   Procedure: OVARIAN CYSTECTOMY;  Surgeon: BrArloa Koh [facility];  Service: Gynecology;  Laterality: Right;    OB History  Gravida Para Term Preterm AB Living  1            SAB TAB Ectopic Multiple Live Births               # Outcome Date GA Lbr Len/2nd Weight Sex Delivery Anes PTL Lv  1 Current             Social History   Socioeconomic History  . Marital status: Married    Spouse name: Not on file  . Number of children: 0  . Years of education: Not on file  . Highest education level: Not on file  Occupational History  . Not on file  Social Needs  . Financial resource strain: Not on file  . Food insecurity    Worry: Not on file    Inability: Not on file  . Transportation needs    Medical: Not on file    Non-medical: Not on file  Tobacco Use  . Smoking status: Never Smoker  . Smokeless tobacco: Never Used  Substance and  Sexual Activity  . Alcohol use: Yes    Comment: occasionally  . Drug use: No  . Sexual activity: Yes    Birth control/protection: Pill   Minocycline base and Minocycline    Prenatal Transfer Tool  Maternal Diabetes: Yes:  Diabetes Type:  Insulin/Medication controlled Genetic Screening: Normal Maternal Ultrasounds/Referrals: Normal Fetal Ultrasounds or other Referrals:  None Maternal Substance Abuse:  No Significant Maternal Medications:  Meds include: Other: glyburide, mesalamine, wellbutrin Significant Maternal Lab Results: Group B Strep positive  ABO, Rh: --/--/O POS (10/23 1235) Antibody: NEG (10/23 1235) Rubella:  Immune RPR:   NR HBsAg:   Neg HIV:   Neg GBS:   Positive     Vitals:   05/14/19 1144 05/14/19 1200  BP: 127/88 123/78  Pulse: 90 88  Resp:    SpO2:       General:  NAD Abdomen:  soft, gravid SVE:  Deferred  FHTs:  150s, moderate variability, + accels, no decels Toco:  quiet   A/P   [age].o. G1P0 3697w4desents with equivocal fetal testing. BPP 6/10.  NST reactive, no decelerations.  Plan admit to  antepartum with continuous monitoring and repeat BPP tomorrow AM.   Pilar Plate breech.  Will make NPO after midnight GDMA2--fasting / 2 hour PP.  Hold glyburide for NPO tonight in event that delivery is required after BPP  Low normal fluid over last few weeks, however AFI still >5 and DVP >2, does not meet definition of oligohydramnios  [place]

## 2019-05-14 NOTE — Progress Notes (Signed)
EFM removed while pt is eating per MD order.

## 2019-05-15 ENCOUNTER — Observation Stay (HOSPITAL_BASED_OUTPATIENT_CLINIC_OR_DEPARTMENT_OTHER): Payer: BC Managed Care – PPO

## 2019-05-15 DIAGNOSIS — O321XX0 Maternal care for breech presentation, not applicable or unspecified: Secondary | ICD-10-CM | POA: Diagnosis not present

## 2019-05-15 DIAGNOSIS — O24415 Gestational diabetes mellitus in pregnancy, controlled by oral hypoglycemic drugs: Secondary | ICD-10-CM | POA: Diagnosis not present

## 2019-05-15 DIAGNOSIS — Z3A36 36 weeks gestation of pregnancy: Secondary | ICD-10-CM

## 2019-05-15 DIAGNOSIS — O10013 Pre-existing essential hypertension complicating pregnancy, third trimester: Secondary | ICD-10-CM

## 2019-05-15 DIAGNOSIS — O368130 Decreased fetal movements, third trimester, not applicable or unspecified: Secondary | ICD-10-CM

## 2019-05-15 DIAGNOSIS — O09513 Supervision of elderly primigravida, third trimester: Secondary | ICD-10-CM | POA: Diagnosis not present

## 2019-05-15 LAB — ABO/RH: ABO/RH(D): O POS

## 2019-05-15 LAB — GLUCOSE, CAPILLARY: Glucose-Capillary: 89 mg/dL (ref 70–99)

## 2019-05-15 MED ORDER — MESALAMINE 1.2 G PO TBEC
2.4000 g | DELAYED_RELEASE_TABLET | Freq: Every day | ORAL | Status: DC
  Filled 2019-05-15: qty 2

## 2019-05-15 NOTE — Progress Notes (Signed)
FHR tracing maternal  HR while patient was up to BR.

## 2019-05-15 NOTE — Discharge Summary (Signed)
Obstetric Discharge Summary Reason for Admission: equivocal fetal testing 6/10 bpp Prenatal Procedures: NST and ultrasound.  51w4dpresents from the office with a BPP of 4/8 (-2 tone, -2 movement).  She had been undergoing routine antenatal testing for GDMA2 on glyburide.  She also reports decreased fetal movment today.  On [place] fluid was low normal--AFI 7.2 cm and DVP 2.1cm.  In MAU, NST was reactive, bringing BPP to 6/10.  Given gestational age < 373 weeks she was admitted to antepartum for observation.  NST remained reactive over the next 18 hours without fetal deceleration.  On HD#1, BPP was repeated and was 10/10.  Patient reported improved fetal movement.  She was discharged to home with plan to f/u BPP in office in 3 days.  Labor precautions, strict FKCs reviewed   Hemoglobin  Date Value Ref Range Status  05/14/2019 13.7 12.0 - 15.0 g/dL Final   HCT  Date Value Ref Range Status  05/14/2019 40.4 36.0 - 46.0 % Final    Physical Exam:  General: alert, cooperative and appears stated age   Discharge Diagnoses: decreased fetal movment  Discharge Information: Date: 05/15/2019 Activity: unrestricted Diet: diabetic Medications: PNV and glyburide, mesalamine, wellbutrin Condition: stable Instructions: refer to practice specific booklet Discharge to: home    DBald [place]10/24/2020, 12:39 PM

## 2019-05-15 NOTE — Progress Notes (Signed)
33 y.o. G1P0 44w5dHD#1 admitted for 6/10 BPP  Pt currently stable.  Reports intermittent mild BH ctx overnight.  Fetal movement increased significantly yesterday and has been normal.  Overnight, fetal has remained reactive with + accelerations and no decelerations  Vitals:   05/14/19 1948 05/14/19 2301 05/15/19 0612 05/15/19 0757  BP: 118/78 122/75 117/70 124/76  Pulse: 95 91 86 95  Resp: 18 18 18 18   Temp: 98.3 F (36.8 C) 98.6 F (37 C) 98.2 F (36.8 C)   TempSrc: Oral Oral Oral   SpO2: 100% 100% 100% 95%  Weight:      Height:        NAD Abd  Soft, gravid, nontender Ex SCDs FHTs  130s, moderate variability, + accels, no decels Toco  quiet  Results for orders placed or performed during the hospital encounter of 05/14/19 (from the past 24 hour(s))  [place] and screen [place]    Status: None   Collection Time: 05/14/19 12:35 PM  Result Value Ref Range   ABO/RH(D) O POS    Antibody Screen NEG    Sample Expiration      05/17/2019,2359 Performed at [facility] [address] [place] [id number]  CBC on admission     Status: Abnormal   Collection Time: 05/14/19 12:35 PM  Result Value Ref Range   WBC 10.7 (H) 4.0 - 10.5 K/uL   RBC 4.60 3.87 - 5.11 MIL/uL   Hemoglobin 13.7 12.0 - 15.0 g/dL   HCT 40.4 36.0 - 46.0 %   MCV 87.8 80.0 - 100.0 fL   MCH 29.8 26.0 - 34.0 pg   MCHC 33.9 30.0 - 36.0 g/dL   RDW 13.3 11.5 - 15.5 %   Platelets 187 150 - 400 K/uL   nRBC 0.0 0.0 - 0.2 %  Comprehensive metabolic panel     Status: Abnormal   Collection Time: 05/14/19 12:35 PM  Result Value Ref Range   Sodium 136 135 - 145 mmol/L   Potassium 4.0 3.5 - 5.1 mmol/L   Chloride 105 98 - 111 mmol/L   CO2 20 (L) 22 - 32 mmol/L   Glucose, Bld 71 70 - 99 mg/dL   BUN 6 6 - 20 mg/dL   Creatinine, Ser 0.63 0.44 - 1.00 mg/dL   Calcium 8.9 8.9 - 10.3 mg/dL   Total Protein 6.0 (L) 6.5 - 8.1 g/dL   Albumin 2.7 (L) 3.5 - 5.0 g/dL   AST 20 15 - 41 U/L   ALT 16  0 - 44 U/L   Alkaline Phosphatase 160 (H) 38 - 126 U/L   Total Bilirubin 0.5 0.3 - 1.2 mg/dL   GFR calc non Af Amer >60 >60 mL/min   GFR calc Af Amer >60 >60 mL/min   Anion gap 11 5 - 15  ABO/Rh     Status: None   Collection Time: 05/14/19 12:35 PM  Result Value Ref Range   ABO/RH(D)      O POS Performed at [facility] [place] [address]  SARS Coronavirus 2 by RT PCR (hospital order[facility]) Nasopharyngeal Nasopharyngeal Swab     Status: None   Collection Time: 05/14/19 12:57 PM   Specimen: Nasopharyngeal Swab  Result Value Ref Range   SARS Coronavirus 2 NEGATIVE NEGATIVE  Glucose, capillary     Status: None   Collection Time: 05/14/19  3:32 PM  Result Value  Ref Range   Glucose-Capillary 95 70 - 99 mg/dL  Glucose, capillary     Status: None   Collection Time: 05/14/19  8:31 PM  Result Value Ref Range   Glucose-Capillary 89 70 - 99 mg/dL  Glucose, capillary     Status: None   Collection Time: 05/15/19  6:15 AM  Result Value Ref Range   Glucose-Capillary 89 70 - 99 mg/dL    A:  HD#1  63w5dwith equivocal fetal testing.  P: Repeat BPP today--prelim result is 8/8 (or 10/10 including NST).  Will await final report, but assuming no change, will plan discharge to home with follow up in office GDMA2 on glyburide  [place]

## 2019-05-15 NOTE — Progress Notes (Signed)
Called Yasemin, ultrasound tech, to inquire when final u/s report would be available. Murlean Iba stated she would contact MFM on call to request reading and final report.

## 2019-05-18 ENCOUNTER — Inpatient Hospital Stay (HOSPITAL_COMMUNITY)
Admission: RE | Admit: 2019-05-18 | Discharge: 2019-05-18 | Disposition: A | Discharge status: Home / Self Care | Payer: BC Managed Care – PPO | Source: Ambulatory Visit

## 2019-05-18 NOTE — Anesthesia Preprocedure Evaluation (Addendum)
Anesthesia Evaluation  Patient identified by MRN, date of birth, ID band Patient awake    Reviewed: Allergy & Precautions, H&P , NPO status , Patient's Chart, lab work & pertinent test results, reviewed documented beta blocker date and time   Airway Mallampati: I  TM Distance: >3 FB Neck ROM: full    Dental no notable dental hx. (+) Teeth Intact   Pulmonary neg pulmonary ROS,    Pulmonary exam normal breath sounds clear to auscultation       Cardiovascular Exercise Tolerance: Good hypertension, Normal cardiovascular exam Rhythm:regular Rate:Normal     Neuro/Psych Depression negative neurological ROS     GI/Hepatic Neg liver ROS, Hx UC, GI bleed   Endo/Other  diabetes, Well Controlled, Gestational, Oral Hypoglycemic AgentsObesity  Renal/GU negative Renal ROS  negative genitourinary   Musculoskeletal negative musculoskeletal ROS (+)   Abdominal (+) + obese,   Peds  Hematology negative hematology ROS (+)   Anesthesia Other Findings On spironolactone for acne  Reproductive/Obstetrics (+) Pregnancy Oligohydramnios Breech presentation AMA                            Anesthesia Physical  Anesthesia Plan  ASA: II  Anesthesia Plan: Spinal   Post-op Pain Management:    Induction:   PONV Risk Score and Plan: 2 and Ondansetron and Dexamethasone  Airway Management Planned: Natural Airway  Additional Equipment: None  Intra-op Plan:   Post-operative Plan:   Informed Consent: I have reviewed the patients History and Physical, chart, labs and discussed the procedure including the risks, benefits and alternatives for the proposed anesthesia with the patient or authorized representative who has indicated his/her understanding and acceptance.     Dental advisory given  Plan Discussed with: CRNA and Surgeon  Anesthesia Plan Comments:        Anesthesia Quick Evaluation

## 2019-05-18 NOTE — Pre-Procedure Instructions (Signed)
Pt instructed to arrive 3 hours early for labs and covid test

## 2019-05-19 ENCOUNTER — Other Ambulatory Visit: Payer: Self-pay

## 2019-05-19 ENCOUNTER — Encounter (HOSPITAL_COMMUNITY): Payer: Self-pay | Admitting: Anesthesiology

## 2019-05-19 ENCOUNTER — Inpatient Hospital Stay (HOSPITAL_COMMUNITY)
Admission: RE | Admit: 2019-05-19 | Discharge: 2019-05-21 | DRG: 787 | Disposition: A | Discharge status: Home / Self Care | Payer: BC Managed Care – PPO | Attending: Obstetrics | Admitting: Obstetrics

## 2019-05-19 ENCOUNTER — Inpatient Hospital Stay (HOSPITAL_COMMUNITY): Payer: BC Managed Care – PPO | Admitting: Anesthesiology

## 2019-05-19 ENCOUNTER — Encounter (HOSPITAL_COMMUNITY)
Admission: RE | Disposition: A | Discharge status: Home / Self Care | Payer: Self-pay | Source: Home / Self Care | Attending: Obstetrics

## 2019-05-19 DIAGNOSIS — Z3A37 37 weeks gestation of pregnancy: Secondary | ICD-10-CM

## 2019-05-19 DIAGNOSIS — K5100 Ulcerative (chronic) pancolitis without complications: Secondary | ICD-10-CM | POA: Diagnosis present

## 2019-05-19 DIAGNOSIS — O99214 Obesity complicating childbirth: Secondary | ICD-10-CM | POA: Diagnosis present

## 2019-05-19 DIAGNOSIS — O9962 Diseases of the digestive system complicating childbirth: Secondary | ICD-10-CM | POA: Diagnosis present

## 2019-05-19 DIAGNOSIS — E669 Obesity, unspecified: Secondary | ICD-10-CM | POA: Diagnosis present

## 2019-05-19 DIAGNOSIS — O321XX0 Maternal care for breech presentation, not applicable or unspecified: Principal | ICD-10-CM | POA: Diagnosis present

## 2019-05-19 DIAGNOSIS — Z20828 Contact with and (suspected) exposure to other viral communicable diseases: Secondary | ICD-10-CM | POA: Diagnosis present

## 2019-05-19 DIAGNOSIS — O4103X0 Oligohydramnios, third trimester, not applicable or unspecified: Secondary | ICD-10-CM | POA: Diagnosis present

## 2019-05-19 DIAGNOSIS — O1002 Pre-existing essential hypertension complicating childbirth: Secondary | ICD-10-CM | POA: Diagnosis present

## 2019-05-19 DIAGNOSIS — O09299 Supervision of pregnancy with other poor reproductive or obstetric history, unspecified trimester: Secondary | ICD-10-CM | POA: Diagnosis present

## 2019-05-19 DIAGNOSIS — O4103X1 Oligohydramnios, third trimester, fetus 1: Secondary | ICD-10-CM | POA: Diagnosis present

## 2019-05-19 DIAGNOSIS — O99824 Streptococcus B carrier state complicating childbirth: Secondary | ICD-10-CM | POA: Diagnosis present

## 2019-05-19 DIAGNOSIS — O24425 Gestational diabetes mellitus in childbirth, controlled by oral hypoglycemic drugs: Secondary | ICD-10-CM | POA: Diagnosis present

## 2019-05-19 LAB — TYPE AND SCREEN
ABO/RH(D): O POS
Antibody Screen: NEGATIVE

## 2019-05-19 LAB — CBC
HCT: 41.7 % (ref 36.0–46.0)
HCT: 38.7 % (ref 36.0–46.0)
Hemoglobin: 13.5 g/dL (ref 12.0–15.0)
Hemoglobin: 12.6 g/dL (ref 12.0–15.0)
MCH: 28.8 pg (ref 26.0–34.0)
MCH: 28.9 pg (ref 26.0–34.0)
MCHC: 32.4 g/dL (ref 30.0–36.0)
MCHC: 32.6 g/dL (ref 30.0–36.0)
MCV: 89.1 fL (ref 80.0–100.0)
MCV: 88.8 fL (ref 80.0–100.0)
Platelets: 171 10*3/uL (ref 150–400)
Platelets: 178 10*3/uL (ref 150–400)
RBC: 4.68 MIL/uL (ref 3.87–5.11)
RBC: 4.36 MIL/uL (ref 3.87–5.11)
RDW: 13.2 % (ref 11.5–15.5)
RDW: 13.2 % (ref 11.5–15.5)
WBC: 9.2 10*3/uL (ref 4.0–10.5)
WBC: 9.4 10*3/uL (ref 4.0–10.5)
nRBC: 0 % (ref 0.0–0.2)
nRBC: 0 % (ref 0.0–0.2)

## 2019-05-19 LAB — GLUCOSE, CAPILLARY: Glucose-Capillary: 82 mg/dL (ref 70–99)

## 2019-05-19 LAB — SARS CORONAVIRUS 2 BY RT PCR (HOSPITAL ORDER, PERFORMED IN ~~LOC~~ HOSPITAL LAB): SARS Coronavirus 2: NEGATIVE

## 2019-05-19 SURGERY — Surgical Case
Anesthesia: Spinal

## 2019-05-19 MED ORDER — OXYTOCIN 40 UNITS IN NORMAL SALINE INFUSION - SIMPLE MED: 2.5000 [IU]/h | INTRAVENOUS | Status: AC

## 2019-05-19 MED ORDER — SODIUM CHLORIDE 0.9% FLUSH
3.0000 mL | INTRAVENOUS | Status: DC | PRN
  Administered 2019-05-20: 3 mL via INTRAVENOUS
  Filled 2019-05-19: qty 3

## 2019-05-19 MED ORDER — MESALAMINE ER 0.375 G PO CP24: 1.5000 g | ORAL_CAPSULE | Freq: Every day | ORAL | Status: DC

## 2019-05-19 MED ORDER — FENTANYL CITRATE (PF) 100 MCG/2ML IJ SOLN
INTRAMUSCULAR | Status: AC
  Filled 2019-05-19: qty 2

## 2019-05-19 MED ORDER — PHENYLEPHRINE HCL-NACL 20-0.9 MG/250ML-% IV SOLN
INTRAVENOUS | Status: AC
  Filled 2019-05-19: qty 250

## 2019-05-19 MED ORDER — NALBUPHINE HCL 10 MG/ML IJ SOLN
5.0000 mg | INTRAMUSCULAR | Status: DC | PRN
  Filled 2019-05-19: qty 0.5

## 2019-05-19 MED ORDER — KETOROLAC TROMETHAMINE 30 MG/ML IJ SOLN
30.0000 mg | Freq: Once | INTRAMUSCULAR | Status: AC | PRN
  Administered 2019-05-19: 30 mg via INTRAVENOUS

## 2019-05-19 MED ORDER — NALBUPHINE HCL 10 MG/ML IJ SOLN
5.0000 mg | Freq: Once | INTRAMUSCULAR | Status: DC | PRN
  Filled 2019-05-19: qty 0.5

## 2019-05-19 MED ORDER — PROMETHAZINE HCL 25 MG/ML IJ SOLN: 6.2500 mg | INTRAMUSCULAR | Status: DC | PRN

## 2019-05-19 MED ORDER — DEXAMETHASONE SODIUM PHOSPHATE 10 MG/ML IJ SOLN
INTRAMUSCULAR | Status: AC
  Filled 2019-05-19: qty 1

## 2019-05-19 MED ORDER — SENNOSIDES-DOCUSATE SODIUM 8.6-50 MG PO TABS
2.0000 | ORAL_TABLET | ORAL | Status: DC
  Administered 2019-05-19: 2 via ORAL
  Filled 2019-05-19 (×2): qty 2

## 2019-05-19 MED ORDER — MENTHOL 3 MG MT LOZG: 1.0000 | LOZENGE | OROMUCOSAL | Status: DC | PRN

## 2019-05-19 MED ORDER — CEFAZOLIN SODIUM-DEXTROSE 2-4 GM/100ML-% IV SOLN
INTRAVENOUS | Status: AC
  Filled 2019-05-19: qty 100

## 2019-05-19 MED ORDER — KETOROLAC TROMETHAMINE 30 MG/ML IJ SOLN
30.0000 mg | Freq: Four times a day (QID) | INTRAMUSCULAR | Status: AC | PRN
  Administered 2019-05-20: 30 mg via INTRAVENOUS

## 2019-05-19 MED ORDER — NALOXONE HCL 4 MG/10ML IJ SOLN
1.0000 ug/kg/h | INTRAVENOUS | Status: DC | PRN
  Filled 2019-05-19: qty 5

## 2019-05-19 MED ORDER — LACTATED RINGERS IV SOLN
INTRAVENOUS | Status: DC
  Administered 2019-05-19 (×2): via INTRAVENOUS

## 2019-05-19 MED ORDER — MEPERIDINE HCL 25 MG/ML IJ SOLN: 6.2500 mg | INTRAMUSCULAR | Status: DC | PRN

## 2019-05-19 MED ORDER — NALOXONE HCL 0.4 MG/ML IJ SOLN: 0.4000 mg | INTRAMUSCULAR | Status: DC | PRN

## 2019-05-19 MED ORDER — IBUPROFEN 800 MG PO TABS
800.0000 mg | ORAL_TABLET | Freq: Four times a day (QID) | ORAL | Status: DC
  Administered 2019-05-20 – 2019-05-21 (×4): 800 mg via ORAL
  Filled 2019-05-19 (×4): qty 1

## 2019-05-19 MED ORDER — KETOROLAC TROMETHAMINE 30 MG/ML IJ SOLN
INTRAMUSCULAR | Status: AC
  Filled 2019-05-19: qty 1

## 2019-05-19 MED ORDER — SODIUM CHLORIDE 0.9 % IR SOLN
Status: DC | PRN
  Administered 2019-05-19: 1000 mL

## 2019-05-19 MED ORDER — KETOROLAC TROMETHAMINE 30 MG/ML IJ SOLN: 30.0000 mg | Freq: Four times a day (QID) | INTRAMUSCULAR | Status: AC | PRN

## 2019-05-19 MED ORDER — ONDANSETRON HCL 4 MG/2ML IJ SOLN
INTRAMUSCULAR | Status: AC
  Filled 2019-05-19: qty 4

## 2019-05-19 MED ORDER — OXYCODONE HCL 5 MG PO TABS: 5.0000 mg | ORAL_TABLET | ORAL | Status: DC | PRN

## 2019-05-19 MED ORDER — DIPHENHYDRAMINE HCL 25 MG PO CAPS: 25.0000 mg | ORAL_CAPSULE | Freq: Four times a day (QID) | ORAL | Status: DC | PRN

## 2019-05-19 MED ORDER — DIBUCAINE (PERIANAL) 1 % EX OINT: 1.0000 "application " | TOPICAL_OINTMENT | CUTANEOUS | Status: DC | PRN

## 2019-05-19 MED ORDER — BUPIVACAINE IN DEXTROSE 0.75-8.25 % IT SOLN
INTRATHECAL | Status: DC | PRN
  Administered 2019-05-19: 1.5 mL via INTRATHECAL

## 2019-05-19 MED ORDER — ACETAMINOPHEN 500 MG PO TABS
1000.0000 mg | ORAL_TABLET | Freq: Four times a day (QID) | ORAL | Status: DC
  Administered 2019-05-19 – 2019-05-21 (×6): 1000 mg via ORAL
  Filled 2019-05-19 (×7): qty 2

## 2019-05-19 MED ORDER — SIMETHICONE 80 MG PO CHEW
80.0000 mg | CHEWABLE_TABLET | ORAL | Status: DC
  Administered 2019-05-19 – 2019-05-20 (×2): 80 mg via ORAL
  Filled 2019-05-19 (×2): qty 1

## 2019-05-19 MED ORDER — STERILE WATER FOR IRRIGATION IR SOLN
Status: DC | PRN
  Administered 2019-05-19: 1000 mL

## 2019-05-19 MED ORDER — ONDANSETRON HCL 4 MG/2ML IJ SOLN
INTRAMUSCULAR | Status: DC | PRN
  Administered 2019-05-19: 4 mg via INTRAVENOUS

## 2019-05-19 MED ORDER — FENTANYL CITRATE (PF) 100 MCG/2ML IJ SOLN
INTRAMUSCULAR | Status: DC | PRN
  Administered 2019-05-19: 15 ug via INTRATHECAL

## 2019-05-19 MED ORDER — DIPHENHYDRAMINE HCL 50 MG/ML IJ SOLN
12.5000 mg | INTRAMUSCULAR | Status: DC | PRN
  Administered 2019-05-19: 12.5 mg via INTRAVENOUS

## 2019-05-19 MED ORDER — BUPROPION HCL ER (XL) 300 MG PO TB24
300.0000 mg | ORAL_TABLET | Freq: Every day | ORAL | Status: DC
  Administered 2019-05-19 – 2019-05-20 (×2): 300 mg via ORAL
  Filled 2019-05-19 (×3): qty 1

## 2019-05-19 MED ORDER — CEFAZOLIN SODIUM-DEXTROSE 2-4 GM/100ML-% IV SOLN: 2.0000 g | INTRAVENOUS | Status: DC

## 2019-05-19 MED ORDER — MORPHINE SULFATE (PF) 0.5 MG/ML IJ SOLN
INTRAMUSCULAR | Status: AC
  Filled 2019-05-19: qty 10

## 2019-05-19 MED ORDER — METOCLOPRAMIDE HCL 5 MG/ML IJ SOLN
INTRAMUSCULAR | Status: AC
  Filled 2019-05-19: qty 2

## 2019-05-19 MED ORDER — SCOPOLAMINE 1 MG/3DAYS TD PT72
MEDICATED_PATCH | TRANSDERMAL | Status: AC
  Filled 2019-05-19: qty 1

## 2019-05-19 MED ORDER — SODIUM CHLORIDE 0.9 % IV SOLN
INTRAVENOUS | Status: DC | PRN
  Administered 2019-05-19: 13:00:00 via INTRAVENOUS

## 2019-05-19 MED ORDER — SCOPOLAMINE 1 MG/3DAYS TD PT72
MEDICATED_PATCH | TRANSDERMAL | Status: DC | PRN
  Administered 2019-05-19: 1 via TRANSDERMAL

## 2019-05-19 MED ORDER — DIPHENHYDRAMINE HCL 25 MG PO CAPS: 25.0000 mg | ORAL_CAPSULE | ORAL | Status: DC | PRN

## 2019-05-19 MED ORDER — SIMETHICONE 80 MG PO CHEW: 80.0000 mg | CHEWABLE_TABLET | ORAL | Status: DC | PRN

## 2019-05-19 MED ORDER — MORPHINE SULFATE (PF) 0.5 MG/ML IJ SOLN
INTRAMUSCULAR | Status: DC | PRN
  Administered 2019-05-19: .15 mg via INTRATHECAL

## 2019-05-19 MED ORDER — PHENYLEPHRINE HCL-NACL 20-0.9 MG/250ML-% IV SOLN
INTRAVENOUS | Status: DC | PRN
  Administered 2019-05-19: 60 ug/min via INTRAVENOUS

## 2019-05-19 MED ORDER — SIMETHICONE 80 MG PO CHEW
80.0000 mg | CHEWABLE_TABLET | Freq: Three times a day (TID) | ORAL | Status: DC
  Administered 2019-05-20 – 2019-05-21 (×3): 80 mg via ORAL
  Filled 2019-05-19 (×3): qty 1

## 2019-05-19 MED ORDER — OXYCODONE HCL 5 MG PO TABS: 5.0000 mg | ORAL_TABLET | Freq: Once | ORAL | Status: DC | PRN

## 2019-05-19 MED ORDER — ONDANSETRON HCL 4 MG/2ML IJ SOLN
INTRAMUSCULAR | Status: AC
  Filled 2019-05-19: qty 2

## 2019-05-19 MED ORDER — ACETAMINOPHEN 500 MG PO TABS
1000.0000 mg | ORAL_TABLET | Freq: Four times a day (QID) | ORAL | Status: DC
  Administered 2019-05-19: 1000 mg via ORAL
  Filled 2019-05-19: qty 2

## 2019-05-19 MED ORDER — DEXAMETHASONE SODIUM PHOSPHATE 10 MG/ML IJ SOLN
INTRAMUSCULAR | Status: AC
  Filled 2019-05-19: qty 2

## 2019-05-19 MED ORDER — SCOPOLAMINE 1 MG/3DAYS TD PT72: 1.0000 | MEDICATED_PATCH | Freq: Once | TRANSDERMAL | Status: DC

## 2019-05-19 MED ORDER — OXYCODONE HCL 5 MG/5ML PO SOLN: 5.0000 mg | Freq: Once | ORAL | Status: DC | PRN

## 2019-05-19 MED ORDER — OXYTOCIN 40 UNITS IN NORMAL SALINE INFUSION - SIMPLE MED
INTRAVENOUS | Status: DC | PRN
  Administered 2019-05-19: 40 [IU] via INTRAVENOUS

## 2019-05-19 MED ORDER — HYDROMORPHONE HCL 1 MG/ML IJ SOLN: 0.2500 mg | INTRAMUSCULAR | Status: DC | PRN

## 2019-05-19 MED ORDER — DEXAMETHASONE SODIUM PHOSPHATE 10 MG/ML IJ SOLN
INTRAMUSCULAR | Status: DC | PRN
  Administered 2019-05-19: 10 mg via INTRAVENOUS

## 2019-05-19 MED ORDER — KETOROLAC TROMETHAMINE 30 MG/ML IJ SOLN
30.0000 mg | Freq: Four times a day (QID) | INTRAMUSCULAR | Status: AC
  Administered 2019-05-19 – 2019-05-20 (×2): 30 mg via INTRAVENOUS
  Filled 2019-05-19 (×3): qty 1

## 2019-05-19 MED ORDER — LACTATED RINGERS IV SOLN
INTRAVENOUS | Status: DC
  Administered 2019-05-19: 18:00:00 via INTRAVENOUS

## 2019-05-19 MED ORDER — DIPHENHYDRAMINE HCL 50 MG/ML IJ SOLN
INTRAMUSCULAR | Status: AC
  Filled 2019-05-19: qty 1

## 2019-05-19 MED ORDER — ONDANSETRON HCL 4 MG/2ML IJ SOLN: 4.0000 mg | Freq: Three times a day (TID) | INTRAMUSCULAR | Status: DC | PRN

## 2019-05-19 MED ORDER — WITCH HAZEL-GLYCERIN EX PADS: 1.0000 "application " | MEDICATED_PAD | CUTANEOUS | Status: DC | PRN

## 2019-05-19 MED ORDER — PRENATAL MULTIVITAMIN CH
1.0000 | ORAL_TABLET | Freq: Every day | ORAL | Status: DC
  Administered 2019-05-20 – 2019-05-21 (×2): 1 via ORAL
  Filled 2019-05-19 (×2): qty 1

## 2019-05-19 MED ORDER — COCONUT OIL OIL
1.0000 "application " | TOPICAL_OIL | Status: DC | PRN
  Administered 2019-05-20: 1 via TOPICAL

## 2019-05-19 SURGICAL SUPPLY — 32 items
BENZOIN TINCTURE PRP APPL 2/3 (GAUZE/BANDAGES/DRESSINGS) ×3 IMPLANT
CHLORAPREP W/TINT 26ML (MISCELLANEOUS) ×3 IMPLANT
CLAMP CORD UMBIL (MISCELLANEOUS) ×3 IMPLANT
CLOSURE WOUND 1/2 X4 (GAUZE/BANDAGES/DRESSINGS) ×1
CLOTH BEACON ORANGE TIMEOUT ST (SAFETY) ×3 IMPLANT
DRSG OPSITE POSTOP 4X10 (GAUZE/BANDAGES/DRESSINGS) ×3 IMPLANT
ELECT REM PT RETURN 9FT ADLT (ELECTROSURGICAL) ×3
ELECTRODE REM PT RTRN 9FT ADLT (ELECTROSURGICAL) ×1 IMPLANT
GLOVE BIOGEL PI IND STRL 6.5 (GLOVE) ×1 IMPLANT
GLOVE BIOGEL PI IND STRL 7.0 (GLOVE) ×1 IMPLANT
GLOVE BIOGEL PI INDICATOR 6.5 (GLOVE) ×2
GLOVE BIOGEL PI INDICATOR 7.0 (GLOVE) ×2
GLOVE ECLIPSE 6.0 STRL STRAW (GLOVE) ×3 IMPLANT
GOWN STRL REUS W/TWL LRG LVL3 (GOWN DISPOSABLE) ×6 IMPLANT
NS IRRIG 1000ML POUR BTL (IV SOLUTION) ×3 IMPLANT
PACK C SECTION WH (CUSTOM PROCEDURE TRAY) ×3 IMPLANT
PAD OB MATERNITY 4.3X12.25 (PERSONAL CARE ITEMS) ×3 IMPLANT
PENCIL SMOKE EVAC W/HOLSTER (ELECTROSURGICAL) ×3 IMPLANT
RTRCTR C-SECT PINK 25CM LRG (MISCELLANEOUS) ×3 IMPLANT
STRIP CLOSURE SKIN 1/2X4 (GAUZE/BANDAGES/DRESSINGS) ×2 IMPLANT
SUT MNCRL 0 VIOLET CTX 36 (SUTURE) ×2 IMPLANT
SUT MNCRL AB 3-0 PS2 27 (SUTURE) ×3 IMPLANT
SUT MONOCRYL 0 CTX 36 (SUTURE) ×4
SUT PLAIN 2 0 (SUTURE) ×2
SUT PLAIN ABS 2-0 CT1 27XMFL (SUTURE) ×1 IMPLANT
SUT VIC AB 0 CTX 36 (SUTURE) ×4
SUT VIC AB 0 CTX36XBRD ANBCTRL (SUTURE) ×2 IMPLANT
SUT VIC AB 2-0 CT1 27 (SUTURE) ×4
SUT VIC AB 2-0 CT1 TAPERPNT 27 (SUTURE) ×2 IMPLANT
TOWEL OR 17X24 6PK STRL BLUE (TOWEL DISPOSABLE) ×3 IMPLANT
TRAY FOLEY W/BAG SLVR 14FR LF (SET/KITS/TRAYS/PACK) ×3 IMPLANT
WATER STERILE IRR 1000ML POUR (IV SOLUTION) ×3 IMPLANT

## 2019-05-19 NOTE — Lactation Note (Signed)
This note was copied from a baby's chart. Lactation Consultation Note  Patient Name: Loretta Boone [id number] Date: 05/19/2019 Reason for consult: Initial assessment;Primapara;1st time breastfeeding;Maternal endocrine disorder;Early term 37-38.6wks Type of Endocrine Disorder?: Diabetes(GDM on glyburine)  8 hours old ETI female who is being exclusively BF by her mother, she's a P1. Mom is an ICU RN and she'll be going back to school for her doctoral degree and plans on pumping when away from baby. She reported (+) breast changes during the pregnancy but she wasn't really familiar with hand expression.   When [facility] revised hand expression with her, only tiny droplets of colostrum were noted, the other breast had glistening but no drops. Noticed that she has flat nipples but her tissue is very compressible. Mom had GDM during the pregnancy and she was on glyburine. Baby's serum glucose were WNL at 62 and 73 respectively. She has a Spectra DEBP at home.  Offered assistance with latch and mom agreed to do STS with baby, she was already cueing. LC took baby to mother's right breast in football position and she was able to latch after a few tries, had to reposition a couple of times, but she got a deep latch and fed for 10 minutes with a few audible swallows noted.   LC offered to set up a DEBP, instructions, cleaning and storage were reviewed as well as milk storage guidelines. Mom already pumping when exiting the room, praised her for her efforts. Discussed normal newborn behavior, cluster feeding, feeding cues, pumping schedule and lactogenesis II.  Feeding plan:  1. Encouraged mom to feed baby STS 8-12 times/24 hours or sooner if feeding cues are present 2. Hand expression and finger feeding were also encouraged 3. Mom will pump prior feedings to help evert her nipples and will offer any amount of EBM she may get 4. Lactation to F/U tomorrow to reassess if she needs a NS (she asked for one  today)  BF brochure, BF resources and feeding diary were reviewed. Parents reported all questions and concerns were answered, they're both aware of [facility] OP services and will call PRN.  Maternal Data Formula Feeding for Exclusion: No Has patient been taught Hand Expression?: Yes Does the patient have breastfeeding experience prior to this delivery?: No  Feeding Feeding Type: Breast Fed  LATCH Score Latch: Repeated attempts needed to sustain latch, nipple held in mouth throughout feeding, stimulation needed to elicit sucking reflex.  Audible Swallowing: A few with stimulation  Type of Nipple: Flat  Comfort (Breast/Nipple): Soft / non-tender  Hold (Positioning): Assistance needed to correctly position infant at breast and maintain latch.  LATCH Score: 6  Interventions Interventions: Breast feeding basics reviewed;Assisted with latch;Skin to skin;Breast massage;Hand express;Breast compression;Adjust position;Support pillows;Position options;DEBP  Lactation Tools Discussed/Used Tools: Pump Breast pump type: Double-Electric Breast Pump WIC Program: No Pump Review: Setup, frequency, and cleaning Initiated by:: MPeck Date initiated:: 05/19/19   Consult Status Consult Status: Follow-up Date: 05/20/19 Follow-up type: In-patient    Ladon Heney Francene Boyers 05/19/2019, 8:58 PM

## 2019-05-19 NOTE — Op Note (Signed)
Cesarean Section Procedure Note  Pre-operative Diagnosis: 1. Intrauterine pregnancy at [redacted]w[redacted]d 2. Oligohydramnios  Post-operative Diagnosis: same as above  Surgeon: DJerelyn Charles MD  Assistants: SAllyn Kenner DO  Procedure: Primary low transverse cesarean section   Anesthesia: Spinal anesthesia  Estimated Blood Loss: 467 mL         Drains: Foley catheter  Complications:  None; patient tolerated the procedure well.         Disposition: PACU - hemodynamically stable.  Findings:  Normal uterus, tubes and ovaries bilaterally.  Viable female infant, 3060g (6 lb 11.9 oz) Apgars 8, 9.    Procedure Details   After spinal  anesthesia was found to adequate, the patient was placed in the dorsal supine position with a leftward tilt, prepped and draped in the usual sterile manner. A Pfannenstiel incision was made and carried down through the subcutaneous tissue to the fascia.  The fascia was incised in the midline and the fascial incision was extended laterally with Mayo scissors. The superior aspect of the fascial incision was grasped with two Kocher clamps, tented up and the rectus muscles dissected off sharply. The rectus was then dissected off with blunt dissection and Mayo scissors inferiorly. The rectus muscles were separated in the midline. The abdominal peritoneum was identified, tented up, entered bluntly, and the incision was extended superiorly and inferiorly with good visualization of the bladder. The Alexis retractor was deployed. The vesicouterine peritoneum was identified well away from the lower uterine segment.  A scalpel was then used to make a low transverse incision on the uterus which was extended in the cephalad-caudad direction with blunt dissection. The fluid was clear and scant. The fetal breech was identified, elevated out of the pelvis and brought to the hysterotomy.  The fetus was delivered via the frank breech position via the usual maneuvers.  After a 60 second delay per  protocol, the cord was clamped and cut and the infant was passed to the waiting neonatologist.  The placenta was then delivered spontaneously, intact and appear normal, the uterus was cleared of all clot and debris   The hysterotomy was repaired with #0 Monocryl in running locked fashion.  A second imbricating layer of #0 Monocryl was placed.   The Alexis retractor was removed from the abdomen. The peritoneum was examined and all vessels noted to be hemostatic. The abdominal cavity was cleared of all clot and debris.  The peritoneum was closed with 2-0 vicryl in a running fashion.  The rectus muscles were then closed with 2-0 Vicryl. The fascia and rectus muscles were inspected and were hemostatic. The fascia and then the rectus muscles were closed with 0 Vicryl in a running fashion. The subcutaneous layer was irrigated and all bleeders cauterized. The subcutaneous layer was closed with interrupted plain gut. The skin was closed with 3-0 monocryl in a subcuticular fashion. The incision was dressed with benzoine, steri strips and honeycomb dressing. All sponge lap and needle counts were correct x3. Patient tolerated the procedure well and recovered in stable condition following the procedure.

## 2019-05-19 NOTE — Anesthesia Postprocedure Evaluation (Signed)
Anesthesia Post Note  Patient: Loretta Boone  Procedure(s) Performed: CESAREAN SECTION (N/A )     Patient location during evaluation: PACU Anesthesia Type: Spinal Level of consciousness: oriented and awake and alert Pain management: pain level controlled Vital Signs Assessment: post-procedure vital signs reviewed and stable Respiratory status: spontaneous breathing, respiratory function stable and nonlabored ventilation Cardiovascular status: blood pressure returned to baseline and stable Postop Assessment: no headache, no backache, no apparent nausea or vomiting, spinal receding and patient able to bend at knees Anesthetic complications: no         [name]

## 2019-05-19 NOTE — H&P (Signed)
33 y.o. G1P0 @ 39w2dpresents for primary cesarean delivery for breech presentation and oligohydramnios.  She has had low normal fluid over the last several weeks.  On most recent ultrasound, there was a single pocket of fluid with AFI of 3.  Baby is persistent frank breech presentation.   Pregnancy c/b: 1. Ulcerative colitis: on mesalamine 2. GDMA2: on glyburide 2.536mbid.  Growth on 10/9:  EFW 31.5% 5#0 3. AMA 4.  Malpresentation: frank breech 5. Chronic hypertension: possible diagnosis of CHTN in 2017.  Was on metoprolol, but not in past year.  BPs have been normal throughout this pregnancy  Past Medical History:  Diagnosis Date  . Chronic UTI   . GI bleed   . Hypertension   . Infectious colitis   . Ulcerative (chronic) enterocolitis ([place]    Past Surgical History:  Procedure Laterality Date  . BREAST EXCISIONAL BIOPSY Left   . LAPAROSCOPY  02/26/2011   Procedure: LAPAROSCOPY OPERATIVE;  Surgeon: BrArloa Koh [facility];  Service: Gynecology;  Laterality: N/A;  . OVARIAN CYST REMOVAL  02/26/2011   Procedure: OVARIAN CYSTECTOMY;  Surgeon: BrArloa Koh [facility];  Service: Gynecology;  Laterality: Right;    OB History  Gravida Para Term Preterm AB Living  1            SAB TAB Ectopic Multiple Live Births               # Outcome Date GA Lbr Len/2nd Weight Sex Delivery Anes PTL Lv  1 Current             Social History   Socioeconomic History  . Marital status: Married    Spouse name: Not on file  . Number of children: 0  . Years of education: Not on file  . Highest education level: Not on file  Occupational History  . Not on file  Social Needs  . Financial resource strain: Not on file  . Food insecurity    Worry: Not on file    Inability: Not on file  . Transportation needs    Medical: Not on file    Non-medical: Not on file  Tobacco Use  . Smoking status: Never Smoker  . Smokeless tobacco: Never Used  Substance and Sexual Activity  . Alcohol  use: Yes    Comment: occasionally  . Drug use: No  . Sexual activity: Yes    Birth control/protection: Pill   Minocycline base and Minocycline    Prenatal Transfer Tool  Maternal Diabetes: Yes:  Diabetes Type:  Insulin/Medication controlled Genetic Screening: Normal Maternal Ultrasounds/Referrals: Normal Fetal Ultrasounds or other Referrals:  None Maternal Substance Abuse:  No Significant Maternal Medications:  Meds include: Other: glyburide, mesalamine, wellbutrin Significant Maternal Lab Results: Group B Strep positive  ABO, Rh: --/--/O POS (10/28 093016Antibody: NEG (10/28 0951) Rubella: Immune (05/08 0000)Immune RPR: Nonreactive (05/08 0000) NR HBsAg: Negative (05/08 0000) Neg HIV: Non-reactive (05/08 0000) Neg GBS:   Positive     Vitals:   05/19/19 0937  BP: (!) 132/93  Pulse: 93  Resp: 18  Temp: 98.7 F (37.1 C)  SpO2: 97%     NAD Abd  Soft, gravid, nontender Ex SCDs [id number]  BSUS: Persistent breech presentation  Lab Results  Component Value Date   WBC 9.4 05/19/2019   HGB 12.6 05/19/2019   HCT 38.7 05/19/2019   MCV 88.8 05/19/2019   PLT 178 05/19/2019  A/P   33 y.o. G1P0 64w2dpresents for primary cesarean section for breech presentation and oligohydramnios.  Discussed ECV, but with oligohydramnios and nulliparity feel that success would be low.  Pt declines attempt at ECV  Discussed risks of cesarean section to include, but not limited to, infection, bleeding, damage to surrounding strutcures (including bowel, bladder, tubes, ovaries, nerves, vessels, baby), need for additional procedures, risk of blood clot, need for transfusion. Consent signed Ancef 2gm on call to OAlder

## 2019-05-19 NOTE — Anesthesia Procedure Notes (Signed)
Spinal  Patient location during procedure: OR Start time: 05/19/2019 12:08 PM End time: 05/19/2019 12:11 PM Staffing Anesthesiologist: Josephine Igo, MD Performed: anesthesiologist  Preanesthetic Checklist Completed: patient identified, site marked, surgical consent, pre-op evaluation, timeout performed, IV checked, risks and benefits discussed and monitors and equipment checked Spinal Block Patient position: sitting Prep: site prepped and draped and DuraPrep Patient monitoring: heart rate, cardiac monitor, continuous pulse ox and blood pressure Approach: midline Location: L3-4 Injection technique: single-shot Needle Needle type: Pencan  Needle gauge: 24 G Needle length: 9 cm Needle insertion depth: 6 cm Assessment Sensory level: T4 Additional Notes Patient identified. Risks and benefits discussed including failed block, incomplete  Pain control, post dural puncture headache, nerve damage, paralysis, blood pressure Changes, nausea, vomiting, reactions to medications-both toxic and allergic and post Partum back pain. All questions were answered. Patient expressed understanding and wished to proceed. Sterile technique was used throughout procedure. Epidural site was Dressed with sterile barrier dressing. No paresthesias, signs of intravascular injection Or signs of intrathecal spread were encountered.  Patient was more comfortable after the epidural was dosed. Please see RN's note for documentation of vital signs and FHR which are stable.

## 2019-05-19 NOTE — Transfer of Care (Signed)
Immediate Anesthesia Transfer of Care Note  Patient: Loretta Boone  Procedure(s) Performed: CESAREAN SECTION (N/A )  Patient Location: PACU  Anesthesia Type:Spinal  Level of Consciousness: awake, alert  and oriented  Airway & Oxygen Therapy: Patient Spontanous Breathing and Patient connected to nasal cannula oxygen  Post-op Assessment: Report given to RN and Post -op Vital signs reviewed and stable  Post vital signs: Reviewed and stable  Last Vitals:  Vitals Value Taken Time  BP 118/75 05/19/19 1322  Temp    Pulse 93 05/19/19 1324  Resp 14 05/19/19 1324  SpO2 97 % 05/19/19 1324  Vitals shown include unvalidated device data.  Last Pain:  Vitals:   05/19/19 0937  TempSrc: Oral         Complications: No apparent anesthesia complications

## 2019-05-20 LAB — CBC
HCT: 32.3 % — ABNORMAL LOW (ref 36.0–46.0)
Hemoglobin: 10.8 g/dL — ABNORMAL LOW (ref 12.0–15.0)
MCH: 29.5 pg (ref 26.0–34.0)
MCHC: 33.4 g/dL (ref 30.0–36.0)
MCV: 88.3 fL (ref 80.0–100.0)
Platelets: 145 10*3/uL — ABNORMAL LOW (ref 150–400)
RBC: 3.66 MIL/uL — ABNORMAL LOW (ref 3.87–5.11)
RDW: 13.1 % (ref 11.5–15.5)
WBC: 10.8 10*3/uL — ABNORMAL HIGH (ref 4.0–10.5)
nRBC: 0 % (ref 0.0–0.2)

## 2019-05-20 LAB — RPR: RPR Ser Ql: NONREACTIVE

## 2019-05-20 LAB — BIRTH TISSUE RECOVERY COLLECTION (PLACENTA DONATION)

## 2019-05-20 LAB — GLUCOSE, CAPILLARY: Glucose-Capillary: 84 mg/dL (ref 70–99)

## 2019-05-20 NOTE — Lactation Note (Addendum)
This note was copied from a baby's chart. Lactation Consultation Note  Patient Name: Loretta Boone Today's Date: 05/20/2019   Baby 4 hours old 71w2dand per parents baby has been sleepy at the breast and has had 2 good feedings since birth.   Mother has short shaft nipples and mother is finding it difficult for baby to maintain latch. Attempted with and without #20NS. Baby latched and was able to sustain latch for 20 min. With #20NS. Mother prefers football hold.  Mother has been pumping q 3hours after breastfeeding. Demonstrated how to use syringe to prefill NS with colostrum or finger syringe feed. Parents know how to spoon feed. Recommend mother post pump 4-6 times per day for 10-20 min with DEBP on initiation setting. Give baby back volume pumped at the next feeding. Reviewed cleaning.       Maternal Data    Feeding Feeding Type: Breast Fed  LATCH Score Latch: Grasps breast easily, tongue down, lips flanged, rhythmical sucking.  Audible Swallowing: A few with stimulation  Type of Nipple: Flat  Comfort (Breast/Nipple): Soft / non-tender  Hold (Positioning): Assistance needed to correctly position infant at breast and maintain latch.  LATCH Score: 7  Interventions    Lactation Tools Discussed/Used Tools: Nipple SJefferson Fuel  Consult Status      BVivianne MasterBNorth [facility] Center10/29/2020, 5:31 PM

## 2019-05-20 NOTE — Progress Notes (Signed)
  Patient is eating, ambulating, voiding.  Pain control is good.  Vitals:   05/19/19 1800 05/19/19 2145 05/20/19 0217 05/20/19 0620  BP: 103/83 108/73 112/71 105/74  Pulse: 79 68 74 75  Resp: 16 16 16 16   Temp: 98.3 F (36.8 C) 98.2 F (36.8 C) 98.4 F (36.9 C) 98.4 F (36.9 C)  TempSrc: Oral Oral Oral Oral  SpO2: 97% 97% 98% 97%  Weight:      Height:        lungs:   clear to auscultation cor:    RRR Abdomen:  soft, appropriate tenderness, incisions intact and without erythema or exudate ex:    no cords   Lab Results  Component Value Date   WBC 10.8 (H) 05/20/2019   HGB 10.8 (L) 05/20/2019   HCT 32.3 (L) 05/20/2019   MCV 88.3 05/20/2019   PLT 145 (L) 05/20/2019    --/--/O POS (10/28 0951)/RI  A/P    Post operative day 1.  Routine post op and postpartum care.  Expect d/c tomorrow.  Percocet for pain control.

## 2019-05-21 LAB — GLUCOSE, CAPILLARY
Glucose-Capillary: 84 mg/dL (ref 70–99)
Glucose-Capillary: 64 mg/dL — ABNORMAL LOW (ref 70–99)
Glucose-Capillary: 80 mg/dL (ref 70–99)

## 2019-05-21 MED ORDER — IBUPROFEN 800 MG PO TABS: 800.0000 mg | ORAL_TABLET | Freq: Four times a day (QID) | ORAL | 0 refills | Status: DC

## 2019-05-21 NOTE — Progress Notes (Signed)
Fasting glucose this morning was 64. Patient given graham crackers, peanut butter, and juice.

## 2019-05-21 NOTE — Discharge Summary (Signed)
Obstetric Discharge Summary Reason for Admission: cesarean section Prenatal Procedures: NST and ultrasound Intrapartum Procedures: cesarean: low cervical, transverse Postpartum Procedures: none Complications-Operative and Postpartum: none Hemoglobin  Date Value Ref Range Status  05/20/2019 10.8 (L) 12.0 - 15.0 g/dL Final   HCT  Date Value Ref Range Status  05/20/2019 32.3 (L) 36.0 - 46.0 % Final    Physical Exam:  General: alert and cooperative Lochia: appropriate   Discharge Diagnoses: Breech and oligohydramnios and Gest DM  Discharge Information: Date: 05/21/2019 Activity: pelvic rest Diet: routine Medications: PNV and Ibuprofen Condition: stable Instructions: refer to practice specific booklet Discharge to: home Follow-up Information    Jerelyn Charles, MD. Schedule an appointment as soon as possible for a visit in 1 month(s).   Specialty: Obstetrics Contact information: [place] [address] [phone]           Newborn Data: Live born female  Birth Weight: 6 lb 11.9 oz (3060 g) APGAR: 42, 9  Newborn Delivery   Birth date/time: 05/19/2019 12:31:00 Delivery type: C-Section, Low Transverse Trial of labor: No C-section categorization: Primary      Home with mother.  ANDERSON,MARK E 05/21/2019, 9:13 AM

## 2019-05-21 NOTE — Lactation Note (Signed)
This note was copied from a baby's chart. Lactation Consultation Note  Patient Name: Loretta Boone CHEKB'T Date: 05/21/2019 Reason for consult: Follow-up assessment;Difficult latch;Early term 37-38.6wks;Primapara Baby started to fuss.  Assisted with positioning baby in football hold.  24 mm nipple shield applied.  Baby took a few sucks but then feel asleep.  Attempted waking techniques for 10 minutes but no interest in feeding.  Recommended formula supplement and mom agreeable.  Assisted with paced feeding and baby did well.  Plan is to offer breast with feeding cues but at least every 3 hours.  If baby is not actively feeding after 10 minutes supplement with formula/expressed breast milk.  Reviewed volume guidelines.  Recommended outpatient appointment if baby continues to have difficulty at breast.  Maternal Data    Feeding Feeding Type: Breast Fed  LATCH Score Latch: Too sleepy or reluctant, no latch achieved, no sucking elicited.  Audible Swallowing: None  Type of Nipple: Everted at rest and after stimulation  Comfort (Breast/Nipple): Filling, red/small blisters or bruises, mild/mod discomfort  Hold (Positioning): Assistance needed to correctly position infant at breast and maintain latch.  LATCH Score: 4  Interventions    Lactation Tools Discussed/Used     Consult Status Consult Status: Complete Date: 05/21/19 Follow-up type: Call as needed    Ave Filter 05/21/2019, 10:05 AM

## 2019-05-21 NOTE — Lactation Note (Signed)
This note was copied from a baby's chart. Lactation Consultation Note  Patient Name: Loretta Boone [id number] Date: 05/21/2019 Reason for consult: Follow-up assessment;Difficult latch;Early term 37-38.6wks;Primapara Baby is 44 hours old/7% weight loss.  Mom states baby is feeding with 20 mm nipple shield.  Baby needs to be wakened for feedings and can be sleepy with some feeds.  Baby is currently sleeping in FOB's arms.  Waking techniques done.  Baby placed skin to skin in football hold.  Mom c/o nipple soreness and some pain with feeding.  Nipples intact but slightly red.  Attempted to latch without shield but baby could not grasp tissue.  24 mm nipple shield applied.  Baby would not open mouth.  She held shield in mouth with no suck elicited.  Baby remained sleepy.  Will attempt again in one hour.  Possible need for formula discussed with parents.  Mom open to formula.  Mom also post pumping and obtaining very small amounts of colostrum.  Maternal Data    Feeding Feeding Type: Breast Fed  LATCH Score Latch: Too sleepy or reluctant, no latch achieved, no sucking elicited.  Audible Swallowing: None  Type of Nipple: Everted at rest and after stimulation  Comfort (Breast/Nipple): Filling, red/small blisters or bruises, mild/mod discomfort  Hold (Positioning): Assistance needed to correctly position infant at breast and maintain latch.  LATCH Score: 4  Interventions    Lactation Tools Discussed/Used     Consult Status Consult Status: Follow-up Date: 05/21/19 Follow-up type: In-patient    Ave Filter 05/21/2019, 8:37 AM

## 2019-05-21 NOTE — Progress Notes (Signed)
POD#2 Pt without complaints. Would like to go home. Lochia mild VSSAF IMP/ Stable Plan/ Will discharge

## 2019-05-26 ENCOUNTER — Encounter (HOSPITAL_COMMUNITY): Payer: Self-pay | Admitting: *Deleted

## 2019-07-04 ENCOUNTER — Other Ambulatory Visit: Payer: Self-pay | Admitting: Gastroenterology

## 2019-07-04 DIAGNOSIS — K51018 Ulcerative (chronic) pancolitis with other complication: Secondary | ICD-10-CM

## 2019-07-08 ENCOUNTER — Telehealth: Payer: Self-pay | Admitting: Gastroenterology

## 2019-07-08 DIAGNOSIS — K51018 Ulcerative (chronic) pancolitis with other complication: Secondary | ICD-10-CM

## 2019-07-08 NOTE — Telephone Encounter (Signed)
Would you like to refill this medication?

## 2019-07-08 NOTE — Telephone Encounter (Signed)
Please do I believe she is on Apriso 0.381m 4/day. 120, 11 refills Also she was supposed to be taking mesalamine suppositories Please fill whenever she needs Routine follow-up in 6 months. RG

## 2019-07-09 MED ORDER — MESALAMINE ER 0.375 G PO CP24: 1500.0000 mg | ORAL_CAPSULE | Freq: Every day | ORAL | 6 refills | Status: DC

## 2019-07-09 NOTE — Telephone Encounter (Signed)
Sent prescription in to patients pharmacy, patient notified.

## 2019-08-24 ENCOUNTER — Telehealth: Payer: Self-pay | Admitting: Gastroenterology

## 2019-08-24 DIAGNOSIS — K51018 Ulcerative (chronic) pancolitis with other complication: Secondary | ICD-10-CM

## 2019-08-24 NOTE — Telephone Encounter (Signed)
Please review and advise.

## 2019-08-24 NOTE — Telephone Encounter (Signed)
Patient is calling states she has bloody diarrhea and her colitis is flaring up.

## 2019-08-26 ENCOUNTER — Telehealth: Payer: Self-pay | Admitting: Gastroenterology

## 2019-08-26 NOTE — Telephone Encounter (Signed)
Left message for patient to call back to the office;  

## 2019-08-27 ENCOUNTER — Telehealth: Payer: Self-pay

## 2019-08-27 MED ORDER — MESALAMINE 4 G RE ENEM: 4.0000 g | ENEMA | Freq: Every day | RECTAL | 6 refills | Status: DC

## 2019-08-27 MED ORDER — MESALAMINE 4 G RE ENEM: 1.0000 g | ENEMA | Freq: Every day | RECTAL | 0 refills | Status: DC

## 2019-08-27 NOTE — Telephone Encounter (Signed)
Should be Rowasa enemas 4 g p.o. nightly, 30, 6 refills  RG

## 2019-08-27 NOTE — Telephone Encounter (Signed)
Sent new script to pharmacy

## 2019-08-27 NOTE — Telephone Encounter (Signed)
Called and spoke with patient-patient informed of MD recommendations; patient is agreeable with plan of care and verified pharmacy to send RX; RX sent; lab work orders entered so patient can complete lab work at [facility] in [place]; patient understands to call the office in 2 weeks to give an update in symptoms; Patient verbalized understanding of information/instructions;  Patient was advised to call the office at [phone] if questions/concerns arise;

## 2019-08-27 NOTE — Telephone Encounter (Signed)
See additional documentation concerning this patient

## 2019-08-27 NOTE — Telephone Encounter (Signed)
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 difficile, calprotectin -Rowasa enemas 1 g p.o. nightly x 4 weeks -Continue rest of the medications -Let us know how she is in 2 weeks.  If still with problems, may have to give a short course of steroid enemas or p.o. steroids  RG

## 2019-08-27 NOTE — Telephone Encounter (Signed)
Left message for patient to call back to the office;  

## 2019-08-27 NOTE — Telephone Encounter (Signed)
Katie from the pharmacy called asking about instructions on the supportories. Instructions are 15lm but was told they come in 24m. What to do? Call back number is [phone]

## 2019-08-30 ENCOUNTER — Telehealth: Payer: Self-pay | Admitting: Gastroenterology

## 2019-08-30 NOTE — Telephone Encounter (Signed)
Called and spoke with patient- lab order requests have been printed and faxed to [facility] in [place] in order for patient to complete requested lab work;  Patient aware and will call back to the office if need should arise; Patient verbalized understanding of information/instructions;   Patient advised to call back to the office at [phone] should questions/concerns arise;

## 2019-08-30 NOTE — Telephone Encounter (Signed)
Patient states that she was going today to [facility] in [place] to get blood work done- states that she called [facility] and they do not have the orders. She is asking for a call back so she knows that the orders are there so she can head there to get it done.

## 2019-11-22 NOTE — Telephone Encounter (Signed)
Opened in error

## 2020-08-23 ENCOUNTER — Other Ambulatory Visit: Payer: Self-pay | Admitting: Gastroenterology

## 2020-08-23 DIAGNOSIS — K51018 Ulcerative (chronic) pancolitis with other complication: Secondary | ICD-10-CM

## 2022-10-14 DIAGNOSIS — R011 Cardiac murmur, unspecified: Secondary | ICD-10-CM | POA: Diagnosis not present

## 2023-06-02 ENCOUNTER — Encounter: Payer: Self-pay | Admitting: Gastroenterology

## 2023-06-30 ENCOUNTER — Other Ambulatory Visit: Payer: Self-pay | Admitting: Obstetrics and Gynecology

## 2023-06-30 DIAGNOSIS — Z1231 Encounter for screening mammogram for malignant neoplasm of breast: Secondary | ICD-10-CM

## 2023-07-09 ENCOUNTER — Encounter (HOSPITAL_BASED_OUTPATIENT_CLINIC_OR_DEPARTMENT_OTHER): Payer: Self-pay | Admitting: Obstetrics and Gynecology

## 2023-07-09 NOTE — Progress Notes (Signed)
Spoke w/ via phone for pre-op interview--- pt Lab needs dos----  no       Lab results------ no COVID test -----patient states asymptomatic no test needed Arrive at ------- 1215 on 07-11-2023 NPO after MN NO Solid Food.  Clear liquids from MN until--- 1115 Med rec completed Medications to take morning of surgery ----- none Diabetic medication ----- n/a Patient instructed no nail polish to be worn day of surgery Patient instructed to bring photo id and insurance card day of surgery Patient aware to have Driver (ride ) / caregiver    for 24 hours after surgery - grandmother, janice Patient Special Instructions ----- n/a Pre-Op special Instructions -----  sent inbox message to dr Kirtland Bouchard. Ross in epic , requested orders Patient verbalized understanding of instructions that were given at this phone interview. Patient denies chest pain, sob, fever, cough at the interview.

## 2023-07-10 ENCOUNTER — Other Ambulatory Visit: Payer: Self-pay | Admitting: Obstetrics and Gynecology

## 2023-07-10 DIAGNOSIS — O034 Incomplete spontaneous abortion without complication: Secondary | ICD-10-CM

## 2023-07-11 ENCOUNTER — Ambulatory Visit (HOSPITAL_BASED_OUTPATIENT_CLINIC_OR_DEPARTMENT_OTHER): Payer: Commercial Managed Care - PPO | Admitting: Anesthesiology

## 2023-07-11 ENCOUNTER — Other Ambulatory Visit: Payer: Self-pay

## 2023-07-11 ENCOUNTER — Encounter (HOSPITAL_BASED_OUTPATIENT_CLINIC_OR_DEPARTMENT_OTHER)
Admission: RE | Disposition: A | Discharge status: Home / Self Care | Payer: Self-pay | Source: Home / Self Care | Attending: Obstetrics and Gynecology

## 2023-07-11 ENCOUNTER — Encounter (HOSPITAL_BASED_OUTPATIENT_CLINIC_OR_DEPARTMENT_OTHER): Payer: Self-pay | Admitting: Obstetrics and Gynecology

## 2023-07-11 ENCOUNTER — Ambulatory Visit (HOSPITAL_BASED_OUTPATIENT_CLINIC_OR_DEPARTMENT_OTHER)
Admission: RE | Admit: 2023-07-11 | Discharge: 2023-07-11 | Disposition: A | Discharge status: Home / Self Care | Payer: Commercial Managed Care - PPO | Attending: Obstetrics and Gynecology | Admitting: Obstetrics and Gynecology

## 2023-07-11 DIAGNOSIS — Z01818 Encounter for other preprocedural examination: Secondary | ICD-10-CM

## 2023-07-11 DIAGNOSIS — O021 Missed abortion: Secondary | ICD-10-CM | POA: Diagnosis not present

## 2023-07-11 DIAGNOSIS — I10 Essential (primary) hypertension: Secondary | ICD-10-CM | POA: Insufficient documentation

## 2023-07-11 DIAGNOSIS — Z3A00 Weeks of gestation of pregnancy not specified: Secondary | ICD-10-CM

## 2023-07-11 DIAGNOSIS — O034 Incomplete spontaneous abortion without complication: Secondary | ICD-10-CM | POA: Diagnosis present

## 2023-07-11 DIAGNOSIS — Z8711 Personal history of peptic ulcer disease: Secondary | ICD-10-CM | POA: Insufficient documentation

## 2023-07-11 HISTORY — DX: Personal history of urinary (tract) infections: Z87.440

## 2023-07-11 HISTORY — PX: DILATION AND CURETTAGE OF UTERUS: SHX78

## 2023-07-11 HISTORY — DX: Personal history of gestational diabetes: Z86.32

## 2023-07-11 HISTORY — DX: Cardiac murmur, unspecified: R01.1

## 2023-07-11 HISTORY — DX: Presence of spectacles and contact lenses: Z97.3

## 2023-07-11 HISTORY — DX: Personal history of other diseases of the digestive system: Z87.19

## 2023-07-11 HISTORY — DX: Iron deficiency anemia, unspecified: D50.9

## 2023-07-11 HISTORY — DX: Incomplete spontaneous abortion without complication: O03.4

## 2023-07-11 LAB — CBC
HCT: 41.7 % (ref 36.0–46.0)
Hemoglobin: 13.8 g/dL (ref 12.0–15.0)
MCH: 30.2 pg (ref 26.0–34.0)
MCHC: 33.1 g/dL (ref 30.0–36.0)
MCV: 91.2 fL (ref 80.0–100.0)
Platelets: 339 10*3/uL (ref 150–400)
RBC: 4.57 MIL/uL (ref 3.87–5.11)
RDW: 12.7 % (ref 11.5–15.5)
WBC: 9.2 10*3/uL (ref 4.0–10.5)
nRBC: 0 % (ref 0.0–0.2)

## 2023-07-11 LAB — TYPE AND SCREEN
ABO/RH(D): O POS
Antibody Screen: NEGATIVE

## 2023-07-11 SURGERY — DILATION AND CURETTAGE
Anesthesia: General | Site: Vagina

## 2023-07-11 MED ORDER — FENTANYL CITRATE (PF) 100 MCG/2ML IJ SOLN
INTRAMUSCULAR | Status: AC
  Filled 2023-07-11: qty 2

## 2023-07-11 MED ORDER — EPHEDRINE 5 MG/ML INJ
INTRAVENOUS | Status: AC
  Filled 2023-07-11: qty 5

## 2023-07-11 MED ORDER — ACETAMINOPHEN 500 MG PO TABS
ORAL_TABLET | ORAL | Status: AC
  Filled 2023-07-11: qty 2

## 2023-07-11 MED ORDER — ONDANSETRON HCL 4 MG/2ML IJ SOLN
INTRAMUSCULAR | Status: DC | PRN
  Administered 2023-07-11: 4 mg via INTRAVENOUS

## 2023-07-11 MED ORDER — PHENYLEPHRINE 80 MCG/ML (10ML) SYRINGE FOR IV PUSH (FOR BLOOD PRESSURE SUPPORT)
PREFILLED_SYRINGE | INTRAVENOUS | Status: AC
  Filled 2023-07-11: qty 10

## 2023-07-11 MED ORDER — ACETAMINOPHEN 500 MG PO TABS
1000.0000 mg | ORAL_TABLET | Freq: Once | ORAL | Status: AC
Start: 2023-07-11 — End: 2023-07-11
  Administered 2023-07-11: 1000 mg via ORAL

## 2023-07-11 MED ORDER — POVIDONE-IODINE 10 % EX SWAB: 2.0000 | Freq: Once | CUTANEOUS | Status: DC

## 2023-07-11 MED ORDER — ONDANSETRON HCL 4 MG/2ML IJ SOLN: 4.0000 mg | Freq: Once | INTRAMUSCULAR | Status: DC | PRN

## 2023-07-11 MED ORDER — HYDROMORPHONE HCL 1 MG/ML IJ SOLN
0.2500 mg | INTRAMUSCULAR | Status: DC | PRN
Start: 2023-07-11 — End: 2023-07-11

## 2023-07-11 MED ORDER — MIDAZOLAM HCL 2 MG/2ML IJ SOLN
INTRAMUSCULAR | Status: AC
  Filled 2023-07-11: qty 2

## 2023-07-11 MED ORDER — 0.9 % SODIUM CHLORIDE (POUR BTL) OPTIME
TOPICAL | Status: DC | PRN
  Administered 2023-07-11: 1000 mL

## 2023-07-11 MED ORDER — LIDOCAINE HCL (PF) 2 % IJ SOLN
INTRAMUSCULAR | Status: AC
  Filled 2023-07-11: qty 15

## 2023-07-11 MED ORDER — ONDANSETRON HCL 4 MG/2ML IJ SOLN
INTRAMUSCULAR | Status: AC
  Filled 2023-07-11: qty 8

## 2023-07-11 MED ORDER — DEXAMETHASONE SODIUM PHOSPHATE 10 MG/ML IJ SOLN
INTRAMUSCULAR | Status: AC
  Filled 2023-07-11: qty 3

## 2023-07-11 MED ORDER — MIDAZOLAM HCL 2 MG/2ML IJ SOLN
INTRAMUSCULAR | Status: DC | PRN
  Administered 2023-07-11: 2 mg via INTRAVENOUS

## 2023-07-11 MED ORDER — PROPOFOL 500 MG/50ML IV EMUL
INTRAVENOUS | Status: AC
  Filled 2023-07-11: qty 100

## 2023-07-11 MED ORDER — AMISULPRIDE (ANTIEMETIC) 5 MG/2ML IV SOLN: 10.0000 mg | Freq: Once | INTRAVENOUS | Status: DC | PRN

## 2023-07-11 MED ORDER — PHENYLEPHRINE HCL (PRESSORS) 10 MG/ML IV SOLN
INTRAVENOUS | Status: DC | PRN
  Administered 2023-07-11: 160 ug via INTRAVENOUS

## 2023-07-11 MED ORDER — DEXAMETHASONE SODIUM PHOSPHATE 4 MG/ML IJ SOLN
INTRAMUSCULAR | Status: DC | PRN
  Administered 2023-07-11: 8 mg via INTRAVENOUS

## 2023-07-11 MED ORDER — OXYCODONE HCL 5 MG/5ML PO SOLN: 5.0000 mg | Freq: Once | ORAL | Status: DC | PRN

## 2023-07-11 MED ORDER — OXYCODONE HCL 5 MG PO TABS
5.0000 mg | ORAL_TABLET | Freq: Once | ORAL | Status: DC | PRN
Start: 2023-07-11 — End: 2023-07-11

## 2023-07-11 MED ORDER — DEXMEDETOMIDINE HCL IN NACL 80 MCG/20ML IV SOLN
INTRAVENOUS | Status: DC | PRN
  Administered 2023-07-11: 4 ug via INTRAVENOUS

## 2023-07-11 MED ORDER — PROPOFOL 10 MG/ML IV BOLUS
INTRAVENOUS | Status: DC | PRN
  Administered 2023-07-11: 200 mg via INTRAVENOUS

## 2023-07-11 MED ORDER — KETOROLAC TROMETHAMINE 30 MG/ML IJ SOLN
INTRAMUSCULAR | Status: AC
  Filled 2023-07-11: qty 2

## 2023-07-11 MED ORDER — SODIUM CHLORIDE 0.9 % IV SOLN: INTRAVENOUS | Status: DC

## 2023-07-11 MED ORDER — LIDOCAINE HCL 1 % IJ SOLN
INTRAMUSCULAR | Status: DC | PRN
  Administered 2023-07-11: 10 mL

## 2023-07-11 MED ORDER — LIDOCAINE HCL (CARDIAC) PF 100 MG/5ML IV SOSY
PREFILLED_SYRINGE | INTRAVENOUS | Status: DC | PRN
  Administered 2023-07-11: 50 mg via INTRAVENOUS

## 2023-07-11 MED ORDER — FENTANYL CITRATE (PF) 100 MCG/2ML IJ SOLN
INTRAMUSCULAR | Status: DC | PRN
  Administered 2023-07-11: 50 ug via INTRAVENOUS
  Administered 2023-07-11 (×2): 25 ug via INTRAVENOUS

## 2023-07-11 MED ORDER — LACTATED RINGERS IV SOLN: INTRAVENOUS | Status: DC

## 2023-07-11 MED ORDER — PROPOFOL 1000 MG/100ML IV EMUL
INTRAVENOUS | Status: AC
  Filled 2023-07-11: qty 200

## 2023-07-11 SURGICAL SUPPLY — 12 items
CATH ROBINSON RED A/P 16FR (CATHETERS) ×1 IMPLANT
DILATOR CANAL MILEX (MISCELLANEOUS) IMPLANT
DRSG TELFA 3X8 NADH STRL (GAUZE/BANDAGES/DRESSINGS) ×1 IMPLANT
GAUZE 4X4 16PLY ~~LOC~~+RFID DBL (SPONGE) ×1 IMPLANT
GLOVE BIO SURGEON STRL SZ7 (GLOVE) ×1 IMPLANT
GOWN STRL REUS W/TWL LRG LVL3 (GOWN DISPOSABLE) ×2 IMPLANT
KIT TURNOVER CYSTO (KITS) ×1 IMPLANT
PACK VAGINAL MINOR WOMEN LF (CUSTOM PROCEDURE TRAY) ×1 IMPLANT
PAD OB MATERNITY 4.3X12.25 (PERSONAL CARE ITEMS) ×1 IMPLANT
PAD PREP 24X48 CUFFED NSTRL (MISCELLANEOUS) ×1 IMPLANT
SLEEVE SCD COMPRESS KNEE MED (STOCKING) ×1 IMPLANT
TOWEL OR 17X24 6PK STRL BLUE (TOWEL DISPOSABLE) ×2 IMPLANT

## 2023-07-11 NOTE — Transfer of Care (Signed)
Immediate Anesthesia Transfer of Care Note  Patient: Loretta Boone  Procedure(s) Performed: DILATATION AND CURETTAGE (Vagina )  Patient Location: PACU  Anesthesia Type:General  Level of Consciousness: awake, alert , oriented, and patient cooperative  Airway & Oxygen Therapy: Patient Spontanous Breathing  Post-op Assessment: Report given to RN and Post -op Vital signs reviewed and stable  Post vital signs: Reviewed and stable  Last Vitals:  Vitals Value Taken Time  BP 119/90 07/11/23 1436  Temp    Pulse 90 07/11/23 1437  Resp 13 07/11/23 1437  SpO2 97 % 07/11/23 1437  Vitals shown include unfiled device data.  Last Pain:  Vitals:   07/11/23 1219  TempSrc: Oral  PainSc: 1       Patients Stated Pain Goal: 5 (07/11/23 1219)  Complications: No notable events documented.

## 2023-07-11 NOTE — H&P (Signed)
Loretta Boone is an 37 y.o. female.   37 yo female presents for surgical management of retained products of conception. Pt has been followed for early pregnancy failure. She initially elected medical management with misoprostal. However after 2 courses ultrasound demonstrated retained products of conception. She presents for Suction Dilation and Evacuation. R/B/A discussed and informed consent obtained.  No LMP recorded (approximate). (Menstrual status: Other).    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 Estelle Grumbles  ([place]) ;  dx 2019  treated with infusions (entyvio)   Wears contact lenses     Past Surgical History:  Procedure Laterality Date   BREAST MASS EXCISION Left 12/15/2007   @ [facility] by dr c. Jamey Ripa  (fibroadenoma)   CESAREAN SECTION N/A 05/19/2019   Procedure: CESAREAN SECTION;  Surgeon: Marlow Baars, MD;  Location: [facility] LD ORS;  Service: Obstetrics;  Laterality: N/A;   COLONOSCOPY WITH PROPOFOL  01/28/2018   dr Chales Abrahams   LAPAROSCOPY  02/26/2011   Procedure: LAPAROSCOPY OPERATIVE;  Surgeon: Melony Overly;  Location: [facility] ORS;  Service: Gynecology;  Laterality: N/A;   OVARIAN CYST REMOVAL  02/26/2011   Procedure: OVARIAN CYSTECTOMY;  Surgeon: Melony Overly;  Location: [facility] ORS;  Service: Gynecology;  Laterality: Right;    Family History  Problem Relation Age of Onset   Colon polyps Mother    Hypertension Mother    Diabetes Father    Colon polyps Father    Hypertension Father    Heart disease Maternal Grandfather    Colon cancer Maternal Uncle 31       passed away at age 64   Breast cancer Neg Hx     Social History:  reports that she has never  smoked. She has never used smokeless tobacco. She reports that she does not currently use alcohol. She reports that she does not use drugs.  Allergies:  Allergies  Allergen Reactions   Minocycline Base Hives and Swelling   Humira (2 Pen) [Adalimumab] Hives and Swelling    Medications Prior to Admission  Medication Sig Dispense Refill Last Dose/Taking   cetirizine (ZYRTEC) 10 MG tablet Take 10 mg by mouth at bedtime.   07/10/2023   clindamycin (CLEOCIN T) 1 % lotion Apply topically at bedtime.   07/10/2023   D-MANNOSE PO Take 1 capsule by mouth at bedtime.   07/10/2023   Prenatal Vit-Fe Fumarate-FA (MULTIVITAMIN-PRENATAL) 27-0.8 MG TABS tablet Take 1 tablet by mouth at bedtime.   07/10/2023   Probiotic Product (PROBIOTIC DAILY PO) Take 1 capsule by mouth at bedtime.   07/10/2023   vedolizumab (ENTYVIO) 300 MG injection Inject into the vein every 8 (eight) weeks.   06/27/2023    Review of Systems  Blood pressure 132/78, pulse 70, temperature 98.3 F (36.8 C), temperature source Oral, resp. rate 16, height 5\' 3"  (1.6 m), weight 75.9 kg, SpO2 100%, unknown if currently breastfeeding. Physical Exam AOx3, NAD Abd soft Normal work of breathing  Blood type O pos  Results for orders placed or performed during the hospital encounter of 07/11/23 (from the past 24 hours)  CBC  Status: None   Collection Time: 07/11/23 12:31 PM  Result Value Ref Range   WBC 9.2 4.0 - 10.5 K/uL   RBC 4.57 3.87 - 5.11 MIL/uL   Hemoglobin 13.8 12.0 - 15.0 g/dL   HCT 13.2 44.0 - 10.2 %   MCV 91.2 80.0 - 100.0 fL   MCH 30.2 26.0 - 34.0 pg   MCHC 33.1 30.0 - 36.0 g/dL   RDW 72.5 36.6 - 44.0 %   Platelets 339 150 - 400 K/uL   nRBC 0.0 0.0 - 0.2 %  Type and screen     Status: None (Preliminary result)   Collection Time: 07/11/23 12:31 PM  Result Value Ref Range   ABO/RH(D) PENDING    Antibody Screen PENDING    Sample Expiration      07/14/2023,2359 Performed at [facility], [address], [place], [place] [address]     No results found.  Assessment/Plan: 1) Proceed with Suction D&E for retained products of conception 2) SCDs for DVT prophylaxis  Waynard Reeds 07/11/2023, 1:40 PM

## 2023-07-11 NOTE — Discharge Instructions (Signed)

## 2023-07-11 NOTE — Op Note (Signed)
Pre-Operative Diagnosis: 1) retained products of conception Postoperative Diagnosis: 1) retained products of conception Procedure: Suction dilation and evacuation Surgeon: Dr. Waynard Reeds Assistant: None Operative Findings: Midline uterus with nulliparous appearing cervix.  Products of conception Specimen: Products of conception EBL: Minimal  Loretta Boone is a 37 year old gravida who presents for definitive surgical management for retained products of conception after miscarriage. Please see the patient's history and physical for complete details of the history. Management options were discussed with the patient. R/B/A reviewed. Following appropriate informed consent was taken to the operating room. The patient was appropriately identified during a time out procedure. General LMA anesthesia was administered and the patient was placed in the dorsal lithotomy position. The patient was prepped and draped in the normal sterile fashion. A speculum was placed into the vagina, a single-tooth tenaculum was placed on the anterior lip of the cervix, and 10 cc of 1% lidocaine was administered in a paracervical fashion. The cervix was serially dilated with Shawnie Pons dilators. A #7 suction curet was then passed to the fundus, the vacuum was engaged, and 3 suction passes were performed with a curette. A Sharp curettage was performed and a gritty texture was noted. A final suction pass was performed with minimal results. This completed the procedure. The patient tolerated the procedure well was brought to the recovery room in stable condition for the procedure. All sponge, needle, instrument counts were correct.  The patient was transferred to the PACU in stable condition following the procedure.Marland Kitchen

## 2023-07-11 NOTE — Anesthesia Procedure Notes (Signed)
Procedure Name: LMA Insertion Date/Time: 07/11/2023 2:08 PM  Performed by: Earmon Phoenix, CRNAPre-anesthesia Checklist: Patient identified, Emergency Drugs available, Suction available, Patient being monitored and Timeout performed Patient Re-evaluated:Patient Re-evaluated prior to induction Oxygen Delivery Method: Circle system utilized Preoxygenation: Pre-oxygenation with 100% oxygen Induction Type: IV induction Ventilation: Mask ventilation without difficulty LMA: LMA inserted LMA Size: 4.0 Number of attempts: 1 Placement Confirmation: positive ETCO2 and breath sounds checked- equal and bilateral Tube secured with: Tape Dental Injury: Teeth and Oropharynx as per pre-operative assessment

## 2023-07-11 NOTE — Discharge Instr - Supplementary Instructions (Signed)
May take Tylenol after 6:23pm if needed for discomfort.

## 2023-07-11 NOTE — Anesthesia Preprocedure Evaluation (Addendum)
Anesthesia Evaluation  Patient identified by MRN, date of birth, ID band Patient awake    Reviewed: Allergy & Precautions, H&P , NPO status , Patient's Chart, lab work & pertinent test results  Airway Mallampati: III  TM Distance: >3 FB Neck ROM: Full    Dental  (+) Teeth Intact, Dental Advisory Given   Pulmonary neg pulmonary ROS   Pulmonary exam normal breath sounds clear to auscultation       Cardiovascular hypertension (132/78 preop, no home meds), Normal cardiovascular exam Rhythm:Regular Rate:Normal  Per pt, was told she had a murmur- echo done 2024 w/ normal valves, report scanned  No audible murmur on my exam    Neuro/Psych negative neurological ROS  negative psych ROS   GI/Hepatic Neg liver ROS, PUD,,,  Endo/Other  Obesity BMI 30  Renal/GU negative Renal ROS  negative genitourinary   Musculoskeletal negative musculoskeletal ROS (+)    Abdominal   Peds negative pediatric ROS (+)  Hematology negative hematology ROS (+)   Anesthesia Other Findings   Reproductive/Obstetrics Retained products <8wks                             Anesthesia Physical Anesthesia Plan  ASA: 2  Anesthesia Plan: General   Post-op Pain Management: Tylenol PO (pre-op)* and Toradol IV (intra-op)*   Induction: Intravenous  PONV Risk Score and Plan: 3 and Ondansetron, Dexamethasone, Midazolam and Treatment may vary due to age or medical condition  Airway Management Planned: LMA  Additional Equipment: None  Intra-op Plan:   Post-operative Plan: Extubation in OR  Informed Consent: I have reviewed the patients History and Physical, chart, labs and discussed the procedure including the risks, benefits and alternatives for the proposed anesthesia with the patient or authorized representative who has indicated his/her understanding and acceptance.     Dental advisory given  Plan Discussed with:  CRNA  Anesthesia Plan Comments:        Anesthesia Quick Evaluation

## 2023-07-14 ENCOUNTER — Encounter (HOSPITAL_BASED_OUTPATIENT_CLINIC_OR_DEPARTMENT_OTHER): Payer: Self-pay | Admitting: Obstetrics and Gynecology

## 2023-07-15 LAB — SURGICAL PATHOLOGY

## 2023-07-15 NOTE — Addendum Note (Signed)
Addendum  created 07/15/23 0833 by Lannie Fields, DO   Clinical Note Signed

## 2023-07-15 NOTE — Anesthesia Postprocedure Evaluation (Addendum)
Anesthesia Post Note  Patient: Loretta Boone  Procedure(s) Performed: DILATATION AND CURETTAGE (Vagina )     Patient location during evaluation: PACU Anesthesia Type: General Level of consciousness: awake and alert, oriented and patient cooperative Pain management: pain level controlled Vital Signs Assessment: post-procedure vital signs reviewed and stable Respiratory status: spontaneous breathing, nonlabored ventilation and respiratory function stable Cardiovascular status: blood pressure returned to baseline and stable Postop Assessment: no apparent nausea or vomiting Anesthetic complications: no   No notable events documented.  Last Vitals:  Vitals:   07/11/23 1516 07/11/23 1541  BP: 116/65 121/74  Pulse: 66 67  Resp: 17 18  Temp: 36.8 C   SpO2: 99% 100%    Last Pain:  Vitals:   07/11/23 1516  TempSrc:   PainSc: 0-No pain                 Lannie Fields

## 2023-08-01 ENCOUNTER — Ambulatory Visit
Admission: RE | Admit: 2023-08-01 | Discharge: 2023-08-01 | Disposition: A | Discharge status: Home / Self Care | Payer: Self-pay | Source: Ambulatory Visit | Attending: Obstetrics and Gynecology | Admitting: Obstetrics and Gynecology

## 2023-08-01 DIAGNOSIS — Z1231 Encounter for screening mammogram for malignant neoplasm of breast: Secondary | ICD-10-CM

## 2023-12-05 LAB — OB RESULTS CONSOLE GC/CHLAMYDIA
Chlamydia: NEGATIVE
Neisseria Gonorrhea: NEGATIVE

## 2023-12-26 LAB — OB RESULTS CONSOLE HEPATITIS B SURFACE ANTIGEN: Hepatitis B Surface Ag: NEGATIVE

## 2023-12-26 LAB — OB RESULTS CONSOLE ANTIBODY SCREEN: Antibody Screen: NEGATIVE

## 2023-12-26 LAB — OB RESULTS CONSOLE RPR: RPR: NONREACTIVE

## 2023-12-26 LAB — OB RESULTS CONSOLE RUBELLA ANTIBODY, IGM: Rubella: IMMUNE

## 2023-12-26 LAB — HEPATITIS C ANTIBODY: HCV Ab: NEGATIVE

## 2023-12-26 LAB — OB RESULTS CONSOLE HIV ANTIBODY (ROUTINE TESTING): HIV: NONREACTIVE

## 2024-01-12 ENCOUNTER — Other Ambulatory Visit: Payer: Self-pay | Admitting: Obstetrics and Gynecology

## 2024-01-12 ENCOUNTER — Telehealth: Payer: Self-pay

## 2024-01-12 DIAGNOSIS — O09529 Supervision of elderly multigravida, unspecified trimester: Secondary | ICD-10-CM

## 2024-01-22 DIAGNOSIS — K5190 Ulcerative colitis, unspecified, without complications: Secondary | ICD-10-CM | POA: Insufficient documentation

## 2024-01-22 DIAGNOSIS — Z8279 Family history of other congenital malformations, deformations and chromosomal abnormalities: Secondary | ICD-10-CM | POA: Insufficient documentation

## 2024-01-22 DIAGNOSIS — Z98891 History of uterine scar from previous surgery: Secondary | ICD-10-CM | POA: Insufficient documentation

## 2024-01-22 DIAGNOSIS — O09529 Supervision of elderly multigravida, unspecified trimester: Secondary | ICD-10-CM | POA: Insufficient documentation

## 2024-01-22 DIAGNOSIS — Z8632 Personal history of gestational diabetes: Secondary | ICD-10-CM | POA: Insufficient documentation

## 2024-02-20 ENCOUNTER — Other Ambulatory Visit: Payer: Self-pay | Admitting: *Deleted

## 2024-02-20 ENCOUNTER — Ambulatory Visit: Payer: Self-pay | Attending: Obstetrics and Gynecology

## 2024-02-20 ENCOUNTER — Ambulatory Visit: Payer: Self-pay | Admitting: Maternal & Fetal Medicine

## 2024-02-20 VITALS — BP 135/70 | HR 77

## 2024-02-20 DIAGNOSIS — O09529 Supervision of elderly multigravida, unspecified trimester: Secondary | ICD-10-CM | POA: Insufficient documentation

## 2024-02-20 DIAGNOSIS — O09522 Supervision of elderly multigravida, second trimester: Secondary | ICD-10-CM | POA: Insufficient documentation

## 2024-02-20 DIAGNOSIS — O99612 Diseases of the digestive system complicating pregnancy, second trimester: Secondary | ICD-10-CM | POA: Diagnosis not present

## 2024-02-20 DIAGNOSIS — O34219 Maternal care for unspecified type scar from previous cesarean delivery: Secondary | ICD-10-CM

## 2024-02-20 DIAGNOSIS — Z3A20 20 weeks gestation of pregnancy: Secondary | ICD-10-CM | POA: Insufficient documentation

## 2024-02-20 DIAGNOSIS — Z8279 Family history of other congenital malformations, deformations and chromosomal abnormalities: Secondary | ICD-10-CM

## 2024-02-20 DIAGNOSIS — O09299 Supervision of pregnancy with other poor reproductive or obstetric history, unspecified trimester: Secondary | ICD-10-CM | POA: Insufficient documentation

## 2024-02-20 DIAGNOSIS — R1031 Right lower quadrant pain: Secondary | ICD-10-CM | POA: Insufficient documentation

## 2024-02-20 DIAGNOSIS — K5100 Ulcerative (chronic) pancolitis without complications: Secondary | ICD-10-CM

## 2024-02-20 DIAGNOSIS — O99212 Obesity complicating pregnancy, second trimester: Secondary | ICD-10-CM

## 2024-02-20 DIAGNOSIS — O09292 Supervision of pregnancy with other poor reproductive or obstetric history, second trimester: Secondary | ICD-10-CM

## 2024-02-20 DIAGNOSIS — E669 Obesity, unspecified: Secondary | ICD-10-CM | POA: Diagnosis not present

## 2024-02-20 DIAGNOSIS — O24419 Gestational diabetes mellitus in pregnancy, unspecified control: Secondary | ICD-10-CM | POA: Insufficient documentation

## 2024-02-20 DIAGNOSIS — R87612 Low grade squamous intraepithelial lesion on cytologic smear of cervix (LGSIL): Secondary | ICD-10-CM | POA: Insufficient documentation

## 2024-02-20 DIAGNOSIS — D249 Benign neoplasm of unspecified breast: Secondary | ICD-10-CM | POA: Insufficient documentation

## 2024-02-20 NOTE — Progress Notes (Signed)
 Patient information  Patient Name: Loretta Boone  Patient MRN:   [id number]  Referring practice: [facility] Referring Provider: [facility]  Problem List   Patient Active Problem List   Diagnosis Date Noted   Low grade squamous intraepithelial lesion (LGSIL) on Papanicolaou smear of cervix 02/20/2024   Ulcerative colitis (HCC) 01/22/2024   Multigravida of advanced maternal age 40/09/2023   History of gestational diabetes mellitus (GDM) 01/22/2024   History of cesarean section 01/22/2024   Family history of congenital heart disease 01/22/2024   Hx of oligohydramnios in prior pregnancy, currently pregnant 05/19/2019   Recurrent UTI (urinary tract infection) 06/30/2015   Maternal Fetal medicine Consult  Loretta Boone is a 38 y.o. G3P1011 at [redacted]w[redacted]d here for ultrasound and consultation. Loretta Boone is doing well today with no acute concerns.  She had low risk NIPT of a female fetus. Today we focused on the following:   Hx of SAB: complicated by retained products. S/p D&C by Dr. Okey in 2024.   Hx of CD: Due to breech  and oligo at [redacted]w[redacted]d.  She desires a repeat cesarean delivery  Hx of CDH: The patient's daughter, Ava, was born with a VSD and an ASD.  This was closed at [facility] in [place] around 65 months of age.  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  The patient does not know if she or her brother had any congenital heart defect but to their knowledge they have not been diagnosed with this.  Her brother has had 2 previous children with VSDs that spontaneously closed.  She has a fetal echo scheduled with Dr. Lajuanda at [facility].  Ulcerative colitis: The patient was diagnosed in 2020 and has been treated with Entyvio successfully.  She has not had any colon surgery and was told as long as her disease remains in remission she will not need her colon removed anytime soon.  I discussed that the timing of her Entyvio should be  last given around 32 weeks of gestation age to avoid B-cell depletion in the newborn.  She should continue to monitor for signs and symptoms of a flare and notify her GI provider.  I discussed the potential impact of ulcerative colitis on pregnancy which can include fetal growth restriction, preterm birth, and increased risk of cesarean delivery.  If a woman has quiescent IBD without recent use of steroids, the pregnancy can be expected to proceed without increased complications. There is no increased risk of congenital anomalies in patients with IBD.  The patient would like to breast-feed.  Currently it is not known if Entyvio is safe for breast-feeding.  It is expected to get in the breastmilk in small quantities.  She should discuss the risks versus benefit of breast-feeding and medication exposure closer to the time of delivery. With biologic use in pregnancy the newborn should not be given a live vaccine until after 12 months.     The patient had time to ask questions that were answered to her satisfaction.  She verbalized understanding and agrees to proceed with the plan below.  Sonographic findings Single intrauterine pregnancy at 20w 1d. EDD 07/08/2024 based on  LMP  (10/02/23). Fetal cardiac activity:  Observed and appears normal. Presentation: Breech. The anatomic structures that were well seen appear normal without evidence of soft markers. Due to poor acoustic windows some structures remain suboptimally visualized. Fetal biometry shows the estimated fetal weight at the 57 percentile.  Amniotic fluid:  Within normal limits.  MVP: 4.94 cm. Placenta: Anterior. Adnexa: No abnormality visualized. Cervical length: 3.3 cm.  There are limitations of prenatal ultrasound such as the inability to detect certain abnormalities due to poor visualization. Various factors such as fetal position, gestational age and maternal body habitus may increase the difficulty in visualizing the fetal anatomy.     Assessment - IBD in pregnancy - Hx of CD - Hx of SAB - Hx of CHD (prior child with a VSD and ASD) Plan -Detailed ultrasound was done today without abnormalities  -Fetal echo at [facility] -Medications: Continued compliance with her medication is encouraged during pregnancy to reduce the risk of flare. The last dose should occur at 30-[redacted] weeks gestation. With biologic use in pregnancy the newborn should not be given a live vaccine until after 12 months.  -Continue co-management with GI to manage IBD and monitor for flares.  -If an IBD flare is suspected an urgent referral to GI should be placed and GI infections, including Clostridium difficile, should be ruled out. -Baseline labs: including complete blood count, iron panel, vitamin D, folate, vitamin B12, baseline fecal calprotectin. ESR and CRP naturally rise with pregnancy, fecal calprotectin has not been shown to rise during pregnancy, thus making this a potentially good marker to follow. -Follow-up anatomy and fetal growth in 4 to 6 weeks -Serial growth ultrasounds starting around 28 weeks to monitor for fetal growth restriction -Antenatal testing to start around 32 weeks due to the increased risk of stillbirth and high risk pregnancy -Delivery timing pending clinical course but likely around 39 weeks gestion -Notify the pediatric provider regarding the biologic exposure during pregnancy and avoid live vaccines for 6 months after delivery if there is significant medication exposure with a biologic agent during pregnancy -Continue routine prenatal care with referring OB provider    Review of Systems: A review of systems was performed and was negative except per HPI   Vitals and Physical Exam    02/20/2024   12:52 PM 07/11/2023    3:41 PM 07/11/2023    3:16 PM  Vitals with BMI  Systolic 135 121 883  Diastolic 70 74 65  Pulse 77 67 66    Sitting comfortably on the sonogram table Nonlabored breathing Normal rate and  rhythm Abdomen is nontender  Past pregnancies OB History  Gravida Para Term Preterm AB Living  3 1 1  1 1   SAB IAB Ectopic Multiple Live Births  1   0 1    # Outcome Date GA Lbr Len/2nd Weight Sex Type Anes PTL Lv  3 Current           2 Term 05/19/19 [redacted]w[redacted]d  6 lb 11.9 oz (3.06 kg) F CS-LTranv Spinal  LIV  1 SAB              I spent 30 minutes reviewing the patients chart, including labs and images as well as counseling the patient about her medical conditions. Greater than 50% of the time was spent in direct face-to-face patient counseling.  Delora Smaller  MFM, [facility]   02/20/2024  2:11 PM

## 2024-03-26 ENCOUNTER — Other Ambulatory Visit: Payer: Self-pay | Admitting: *Deleted

## 2024-03-26 ENCOUNTER — Ambulatory Visit: Admitting: Obstetrics

## 2024-03-26 ENCOUNTER — Ambulatory Visit: Attending: Obstetrics and Gynecology

## 2024-03-26 VITALS — BP 122/73

## 2024-03-26 DIAGNOSIS — Z3A25 25 weeks gestation of pregnancy: Secondary | ICD-10-CM | POA: Insufficient documentation

## 2024-03-26 DIAGNOSIS — K5090 Crohn's disease, unspecified, without complications: Secondary | ICD-10-CM | POA: Insufficient documentation

## 2024-03-26 DIAGNOSIS — O09299 Supervision of pregnancy with other poor reproductive or obstetric history, unspecified trimester: Secondary | ICD-10-CM | POA: Diagnosis present

## 2024-03-26 DIAGNOSIS — O4692 Antepartum hemorrhage, unspecified, second trimester: Secondary | ICD-10-CM | POA: Diagnosis present

## 2024-03-26 DIAGNOSIS — O99212 Obesity complicating pregnancy, second trimester: Secondary | ICD-10-CM | POA: Diagnosis not present

## 2024-03-26 DIAGNOSIS — K5100 Ulcerative (chronic) pancolitis without complications: Secondary | ICD-10-CM | POA: Insufficient documentation

## 2024-03-26 DIAGNOSIS — O358XX0 Maternal care for other (suspected) fetal abnormality and damage, not applicable or unspecified: Secondary | ICD-10-CM

## 2024-03-26 DIAGNOSIS — O09522 Supervision of elderly multigravida, second trimester: Secondary | ICD-10-CM | POA: Insufficient documentation

## 2024-03-26 DIAGNOSIS — O99612 Diseases of the digestive system complicating pregnancy, second trimester: Secondary | ICD-10-CM

## 2024-03-26 DIAGNOSIS — O09292 Supervision of pregnancy with other poor reproductive or obstetric history, second trimester: Secondary | ICD-10-CM

## 2024-03-26 DIAGNOSIS — O34219 Maternal care for unspecified type scar from previous cesarean delivery: Secondary | ICD-10-CM

## 2024-03-26 DIAGNOSIS — E669 Obesity, unspecified: Secondary | ICD-10-CM

## 2024-03-26 NOTE — Progress Notes (Signed)
 MFM Consult Note  Loretta Boone is currently at 25 weeks and 1 day.  She has been followed due to advanced maternal age (38 years old) and history of ulcerative colitis that is treated with Entyvio.  The patient is a GI Publishing rights manager.  She reports that she continues to experience vaginal spotting.  As her prior child was born with a VSD and ASD, she had a normal fetal echocardiogram performed with [facility] pediatric cardiology.  Sonographic findings Single intrauterine pregnancy at 25w 1d.  Fetal cardiac activity:  Observed and appears normal. Presentation: Cephalic. Interval fetal anatomy appears normal. Fetal biometry shows the estimated fetal weight at the 23 percentile. Amniotic fluid volume: Within normal limits. MVP: 3.9 cm. Placenta: Anterior.  There are limitations of prenatal ultrasound such as the inability to detect certain abnormalities due to poor visualization. Various factors such as fetal position, gestational age and maternal body habitus may increase the difficulty in visualizing the fetal anatomy.    A normal-appearing anterior placenta was noted on today's exam.  However, there appears to be a venous sinus with active venous blood flow extending from the anterior placenta towards her cervix.  She was advised that this is most likely the cause of her continued vaginal spotting.    Hopefully, this venous sinus will move away from the lower uterine segment as the fetus grows and the placenta moves further up and away from the lower uterine segment.  We will continue to assess this finding during her future exams.  The patient was advised that there are some recommendations that state that children born to mothers who received biologic medications during pregnancy such as Entyvio should not receive a live vaccine within the first year of life.    The current live vaccines are the measles, mumps, and rubella (MMR) vaccine, varicella vaccine, and the rotavirus  vaccine.  Based on the current CDC recommendations regarding vaccination for children, the MMR vaccine and the varicella vaccine are not recommended until after 98 months of age.  Her child should not be given the rotavirus during the first 12 months of life.  Due to her history of ulcerative colitis and her age, we will continue to follow her with monthly growth ultrasounds.    Weekly fetal testing should be started at around 34 weeks.    A follow-up growth scan was scheduled in 4 weeks.    The patient stated that all of her questions were answered today.  A total of 30 minutes was spent counseling and coordinating the care for this patient.  Greater than 50% of the time was spent in direct face-to-face contact.

## 2024-04-28 ENCOUNTER — Other Ambulatory Visit: Payer: Self-pay | Admitting: Obstetrics and Gynecology

## 2024-04-28 DIAGNOSIS — Z3490 Encounter for supervision of normal pregnancy, unspecified, unspecified trimester: Secondary | ICD-10-CM

## 2024-04-29 ENCOUNTER — Ambulatory Visit: Attending: Obstetrics and Gynecology

## 2024-04-29 ENCOUNTER — Ambulatory Visit: Admitting: Obstetrics

## 2024-04-29 VITALS — BP 134/73 | HR 106

## 2024-04-29 DIAGNOSIS — O99613 Diseases of the digestive system complicating pregnancy, third trimester: Secondary | ICD-10-CM | POA: Diagnosis present

## 2024-04-29 DIAGNOSIS — O09522 Supervision of elderly multigravida, second trimester: Secondary | ICD-10-CM | POA: Insufficient documentation

## 2024-04-29 DIAGNOSIS — O34219 Maternal care for unspecified type scar from previous cesarean delivery: Secondary | ICD-10-CM

## 2024-04-29 DIAGNOSIS — O358XX0 Maternal care for other (suspected) fetal abnormality and damage, not applicable or unspecified: Secondary | ICD-10-CM | POA: Insufficient documentation

## 2024-04-29 DIAGNOSIS — O09293 Supervision of pregnancy with other poor reproductive or obstetric history, third trimester: Secondary | ICD-10-CM

## 2024-04-29 DIAGNOSIS — Z3A30 30 weeks gestation of pregnancy: Secondary | ICD-10-CM

## 2024-04-29 DIAGNOSIS — O09523 Supervision of elderly multigravida, third trimester: Secondary | ICD-10-CM | POA: Insufficient documentation

## 2024-04-29 DIAGNOSIS — K5090 Crohn's disease, unspecified, without complications: Secondary | ICD-10-CM | POA: Diagnosis present

## 2024-04-29 NOTE — Progress Notes (Signed)
 MFM Consult Note  Loretta Boone is currently at 30 weeks and 0 days.  She has been followed due to advanced maternal age (38 years old) and history of ulcerative colitis that is treated with Entyvio.  The patient is a GI Publishing rights manager.  She reports that she has not experienced any further vaginal bleeding for the past 5 weeks.  She reports that her blood pressures have been mildly elevated.  Her blood pressure today was 134/73.  As her prior child was born with a VSD and ASD, she had a normal fetal echocardiogram performed with [facility] pediatric cardiology.  Sonographic findings Single intrauterine pregnancy at 30w 0d.  Fetal cardiac activity:  Observed and appears normal. Presentation: Cephalic. Fetal biometry shows the estimated fetal weight of 3 pounds 8 ounces which measures at the 56th percentile. Amniotic fluid volume: Within normal limits.  AFI 17.19 cm.  MVP: 5.02 cm. Placenta: Anterior.  Due to her history of ulcerative colitis and her age, she should continue to be followed with growth ultrasounds.  She should have another ultrasound to assess the fetal growth in 4 to 5 weeks.  Weekly fetal testing should be started at around 34 weeks.    The patient reports that she will have her future growth ultrasounds and weekly fetal testing performed in your office.  She already has a repeat cesarean delivery scheduled at around 39 weeks.   Preeclampsia precautions were reviewed today.  No further exams were scheduled in our office.  A total of 20 minutes was spent counseling and coordinating the care for this patient.  Greater than 50% of the time was spent in direct face-to-face contact.

## 2024-06-10 LAB — OB RESULTS CONSOLE GBS: GBS: NEGATIVE

## 2024-06-18 NOTE — Patient Instructions (Signed)
 Loretta Boone  06/18/2024   Your procedure is scheduled on:  07/02/2024  Arrive at 1015 at Entrance C on [address] at [facility]  and [employer]. You are invited to use the FREE valet parking or use the Visitor's parking deck.  Pick up the phone at the desk and dial [phone].  Call this number if you have problems the morning of surgery: [phone]  Remember:   Do not eat food:(After Midnight) Desps de medianoche.  You may drink clear liquids until  ___0815__.  Clear liquids means a liquid you can see thru.  It can have color such as Cola or Kool aid.  Tea is OK and coffee as long as no milk or creamer of any kind.  Take these medicines the morning of surgery with A SIP OF WATER :  none   Do not wear jewelry, make-up or nail polish.  Do not wear lotions, powders, or perfumes. Do not wear deodorant.  Do not shave 48 hours prior to surgery.  Do not bring valuables to the hospital.  [facility] is not   responsible for any belongings or valuables brought to the hospital.  Contacts, dentures or bridgework may not be worn into surgery.  Leave suitcase in the car. After surgery it may be brought to your room.  For patients admitted to the hospital, checkout time is 11:00 AM the day of              discharge.      Please read over the following fact sheets that you were given:     Preparing for Surgery

## 2024-06-21 ENCOUNTER — Encounter (HOSPITAL_COMMUNITY): Payer: Self-pay

## 2024-06-30 ENCOUNTER — Encounter (HOSPITAL_COMMUNITY)
Admission: RE | Admit: 2024-06-30 | Discharge: 2024-06-30 | Disposition: A | Discharge status: Home / Self Care | Source: Ambulatory Visit | Attending: Obstetrics and Gynecology | Admitting: Obstetrics and Gynecology

## 2024-06-30 DIAGNOSIS — Z01812 Encounter for preprocedural laboratory examination: Secondary | ICD-10-CM | POA: Insufficient documentation

## 2024-06-30 DIAGNOSIS — Z3490 Encounter for supervision of normal pregnancy, unspecified, unspecified trimester: Secondary | ICD-10-CM

## 2024-06-30 LAB — CBC
HCT: 38.8 % (ref 36.0–46.0)
Hemoglobin: 12.5 g/dL (ref 12.0–15.0)
MCH: 28.8 pg (ref 26.0–34.0)
MCHC: 32.2 g/dL (ref 30.0–36.0)
MCV: 89.4 fL (ref 80.0–100.0)
Platelets: 212 K/uL (ref 150–400)
RBC: 4.34 MIL/uL (ref 3.87–5.11)
RDW: 13.1 % (ref 11.5–15.5)
WBC: 8.5 K/uL (ref 4.0–10.5)
nRBC: 0 % (ref 0.0–0.2)

## 2024-06-30 LAB — TYPE AND SCREEN
ABO/RH(D): O POS
Antibody Screen: NEGATIVE

## 2024-06-30 LAB — SYPHILIS: RPR W/REFLEX TO RPR TITER AND TREPONEMAL ANTIBODIES, TRADITIONAL SCREENING AND DIAGNOSIS ALGORITHM: RPR Ser Ql: NONREACTIVE

## 2024-07-02 ENCOUNTER — Inpatient Hospital Stay (HOSPITAL_COMMUNITY): Admitting: Anesthesiology

## 2024-07-02 ENCOUNTER — Other Ambulatory Visit: Payer: Self-pay

## 2024-07-02 ENCOUNTER — Encounter (HOSPITAL_COMMUNITY): Payer: Self-pay | Admitting: Obstetrics and Gynecology

## 2024-07-02 ENCOUNTER — Inpatient Hospital Stay (HOSPITAL_COMMUNITY)
Admission: RE | Admit: 2024-07-02 | Discharge: 2024-07-04 | DRG: 787 | Disposition: A | Attending: Obstetrics and Gynecology | Admitting: Obstetrics and Gynecology

## 2024-07-02 ENCOUNTER — Encounter (HOSPITAL_COMMUNITY): Admission: RE | Disposition: A | Payer: Self-pay | Source: Home / Self Care | Attending: Obstetrics and Gynecology

## 2024-07-02 DIAGNOSIS — O34211 Maternal care for low transverse scar from previous cesarean delivery: Secondary | ICD-10-CM | POA: Diagnosis not present

## 2024-07-02 DIAGNOSIS — Z3A39 39 weeks gestation of pregnancy: Secondary | ICD-10-CM

## 2024-07-02 DIAGNOSIS — Z8249 Family history of ischemic heart disease and other diseases of the circulatory system: Secondary | ICD-10-CM

## 2024-07-02 DIAGNOSIS — O9962 Diseases of the digestive system complicating childbirth: Secondary | ICD-10-CM | POA: Diagnosis present

## 2024-07-02 DIAGNOSIS — Z3490 Encounter for supervision of normal pregnancy, unspecified, unspecified trimester: Secondary | ICD-10-CM

## 2024-07-02 DIAGNOSIS — Z833 Family history of diabetes mellitus: Secondary | ICD-10-CM

## 2024-07-02 DIAGNOSIS — K5190 Ulcerative colitis, unspecified, without complications: Secondary | ICD-10-CM | POA: Diagnosis present

## 2024-07-02 SURGERY — Surgical Case
Anesthesia: Spinal

## 2024-07-02 MED ORDER — PRENATAL MULTIVITAMIN CH
1.0000 | ORAL_TABLET | Freq: Every day | ORAL | Status: DC
  Administered 2024-07-03: 1 via ORAL
  Filled 2024-07-02: qty 1

## 2024-07-02 MED ORDER — NALOXONE HCL 0.4 MG/ML IJ SOLN: 0.4000 mg | INTRAMUSCULAR | Status: DC | PRN

## 2024-07-02 MED ORDER — ZOLPIDEM TARTRATE 5 MG PO TABS: 5.0000 mg | ORAL_TABLET | Freq: Every evening | ORAL | Status: DC | PRN

## 2024-07-02 MED ORDER — DIPHENHYDRAMINE HCL 25 MG PO CAPS: 25.0000 mg | ORAL_CAPSULE | ORAL | Status: DC | PRN

## 2024-07-02 MED ORDER — DIPHENHYDRAMINE HCL 25 MG PO CAPS: 25.0000 mg | ORAL_CAPSULE | Freq: Four times a day (QID) | ORAL | Status: DC | PRN

## 2024-07-02 MED ORDER — MORPHINE SULFATE (PF) 0.5 MG/ML IJ SOLN
INTRAMUSCULAR | Status: AC
  Filled 2024-07-02: qty 10

## 2024-07-02 MED ORDER — KETOROLAC TROMETHAMINE 30 MG/ML IJ SOLN
INTRAMUSCULAR | Status: AC
  Filled 2024-07-02: qty 1

## 2024-07-02 MED ORDER — WITCH HAZEL-GLYCERIN EX PADS: 1.0000 | MEDICATED_PAD | CUTANEOUS | Status: DC | PRN

## 2024-07-02 MED ORDER — PHENYLEPHRINE 80 MCG/ML (10ML) SYRINGE FOR IV PUSH (FOR BLOOD PRESSURE SUPPORT)
PREFILLED_SYRINGE | INTRAVENOUS | Status: AC
  Filled 2024-07-02: qty 10

## 2024-07-02 MED ORDER — MORPHINE SULFATE (PF) 0.5 MG/ML IJ SOLN
INTRAMUSCULAR | Status: DC | PRN
  Administered 2024-07-02: 150 ug via INTRATHECAL

## 2024-07-02 MED ORDER — ACETAMINOPHEN 500 MG PO TABS: 1000.0000 mg | ORAL_TABLET | Freq: Four times a day (QID) | ORAL | Status: DC

## 2024-07-02 MED ORDER — FAMOTIDINE 20 MG PO TABS: 20.0000 mg | ORAL_TABLET | Freq: Two times a day (BID) | ORAL | Status: DC

## 2024-07-02 MED ORDER — OXYCODONE HCL 5 MG PO TABS
5.0000 mg | ORAL_TABLET | ORAL | Status: DC | PRN
  Administered 2024-07-04 (×2): 5 mg via ORAL
  Filled 2024-07-02 (×2): qty 1

## 2024-07-02 MED ORDER — LACTATED RINGERS IV SOLN: INTRAVENOUS | Status: AC

## 2024-07-02 MED ORDER — KETOROLAC TROMETHAMINE 30 MG/ML IJ SOLN: 30.0000 mg | Freq: Four times a day (QID) | INTRAMUSCULAR | Status: DC | PRN

## 2024-07-02 MED ORDER — KETOROLAC TROMETHAMINE 30 MG/ML IJ SOLN
30.0000 mg | Freq: Once | INTRAMUSCULAR | Status: AC | PRN
  Administered 2024-07-02: 30 mg via INTRAVENOUS

## 2024-07-02 MED ORDER — PHENYLEPHRINE HCL-NACL 20-0.9 MG/250ML-% IV SOLN
INTRAVENOUS | Status: DC | PRN
  Administered 2024-07-02: 30 ug/min via INTRAVENOUS

## 2024-07-02 MED ORDER — DIBUCAINE (PERIANAL) 1 % EX OINT: 1.0000 | TOPICAL_OINTMENT | CUTANEOUS | Status: DC | PRN

## 2024-07-02 MED ORDER — PHENYLEPHRINE HCL-NACL 20-0.9 MG/250ML-% IV SOLN
INTRAVENOUS | Status: AC
  Filled 2024-07-02: qty 250

## 2024-07-02 MED ORDER — COCONUT OIL OIL
1.0000 | TOPICAL_OIL | Status: DC | PRN
  Administered 2024-07-04: 1 via TOPICAL

## 2024-07-02 MED ORDER — POVIDONE-IODINE 10 % EX SWAB
2.0000 | Freq: Once | CUTANEOUS | Status: AC
  Administered 2024-07-02: 2 via TOPICAL

## 2024-07-02 MED ORDER — LACTATED RINGERS IV SOLN
INTRAVENOUS | Status: DC
  Administered 2024-07-02: 40 mL/h via INTRAVENOUS
  Administered 2024-07-02: 125 mL/h via INTRAVENOUS

## 2024-07-02 MED ORDER — FENTANYL CITRATE (PF) 100 MCG/2ML IJ SOLN
INTRAMUSCULAR | Status: DC | PRN
  Administered 2024-07-02: 15 ug via INTRATHECAL

## 2024-07-02 MED ORDER — OXYTOCIN-SODIUM CHLORIDE 30-0.9 UT/500ML-% IV SOLN
2.5000 [IU]/h | INTRAVENOUS | Status: AC
  Administered 2024-07-02 (×2): 2.5 [IU]/h via INTRAVENOUS
  Filled 2024-07-02: qty 500

## 2024-07-02 MED ORDER — METHYLERGONOVINE MALEATE 0.2 MG/ML IJ SOLN: 0.2000 mg | INTRAMUSCULAR | Status: DC | PRN

## 2024-07-02 MED ORDER — ACETAMINOPHEN 10 MG/ML IV SOLN
INTRAVENOUS | Status: DC | PRN
  Administered 2024-07-02: 1000 mg via INTRAVENOUS

## 2024-07-02 MED ORDER — DEXAMETHASONE SOD PHOSPHATE PF 10 MG/ML IJ SOLN
INTRAMUSCULAR | Status: DC | PRN
  Administered 2024-07-02 (×2): 5 mg via INTRAVENOUS

## 2024-07-02 MED ORDER — OXYCODONE HCL 5 MG/5ML PO SOLN: 5.0000 mg | Freq: Once | ORAL | Status: DC | PRN

## 2024-07-02 MED ORDER — PHENYLEPHRINE 80 MCG/ML (10ML) SYRINGE FOR IV PUSH (FOR BLOOD PRESSURE SUPPORT)
PREFILLED_SYRINGE | INTRAVENOUS | Status: DC | PRN
  Administered 2024-07-02: 160 ug via INTRAVENOUS

## 2024-07-02 MED ORDER — EPHEDRINE SULFATE-NACL 50-0.9 MG/10ML-% IV SOSY
PREFILLED_SYRINGE | INTRAVENOUS | Status: DC | PRN
  Administered 2024-07-02: 5 mg via INTRAVENOUS
  Administered 2024-07-02: 10 mg via INTRAVENOUS

## 2024-07-02 MED ORDER — NALOXONE HCL 4 MG/10ML IJ SOLN: 1.0000 ug/kg/h | INTRAVENOUS | Status: DC | PRN

## 2024-07-02 MED ORDER — SODIUM CHLORIDE 0.9% FLUSH: 3.0000 mL | INTRAVENOUS | Status: DC | PRN

## 2024-07-02 MED ORDER — MENTHOL 3 MG MT LOZG: 1.0000 | LOZENGE | OROMUCOSAL | Status: DC | PRN

## 2024-07-02 MED ORDER — OXYTOCIN-SODIUM CHLORIDE 30-0.9 UT/500ML-% IV SOLN
INTRAVENOUS | Status: DC | PRN
  Administered 2024-07-02: 300 mL via INTRAVENOUS

## 2024-07-02 MED ORDER — SIMETHICONE 80 MG PO CHEW: 80.0000 mg | CHEWABLE_TABLET | ORAL | Status: DC | PRN

## 2024-07-02 MED ORDER — METHYLERGONOVINE MALEATE 0.2 MG PO TABS: 0.2000 mg | ORAL_TABLET | ORAL | Status: DC | PRN

## 2024-07-02 MED ORDER — SENNOSIDES-DOCUSATE SODIUM 8.6-50 MG PO TABS: 2.0000 | ORAL_TABLET | Freq: Every day | ORAL | Status: DC

## 2024-07-02 MED ORDER — FENTANYL CITRATE (PF) 100 MCG/2ML IJ SOLN: 25.0000 ug | INTRAMUSCULAR | Status: DC | PRN

## 2024-07-02 MED ORDER — DIPHENHYDRAMINE HCL 50 MG/ML IJ SOLN: 12.5000 mg | INTRAMUSCULAR | Status: DC | PRN

## 2024-07-02 MED ORDER — KETOROLAC TROMETHAMINE 30 MG/ML IJ SOLN
30.0000 mg | Freq: Four times a day (QID) | INTRAMUSCULAR | Status: AC
  Administered 2024-07-02 – 2024-07-03 (×3): 30 mg via INTRAVENOUS
  Filled 2024-07-02 (×3): qty 1

## 2024-07-02 MED ORDER — BUPIVACAINE IN DEXTROSE 0.75-8.25 % IT SOLN
INTRATHECAL | Status: DC | PRN
  Administered 2024-07-02: 1.6 mL via INTRATHECAL

## 2024-07-02 MED ORDER — ONDANSETRON HCL 4 MG/2ML IJ SOLN
INTRAMUSCULAR | Status: DC | PRN
  Administered 2024-07-02: 4 mg via INTRAVENOUS

## 2024-07-02 MED ORDER — CEFAZOLIN SODIUM-DEXTROSE 2-4 GM/100ML-% IV SOLN
2.0000 g | INTRAVENOUS | Status: AC
  Administered 2024-07-02: 2 g via INTRAVENOUS

## 2024-07-02 MED ORDER — TRANEXAMIC ACID-NACL 1000-0.7 MG/100ML-% IV SOLN
1000.0000 mg | Freq: Once | INTRAVENOUS | Status: AC
  Administered 2024-07-02: 1000 mg via INTRAVENOUS

## 2024-07-02 MED ORDER — SCOPOLAMINE 1 MG/3DAYS TD PT72
1.0000 | MEDICATED_PATCH | Freq: Once | TRANSDERMAL | Status: DC
  Administered 2024-07-02: 1 mg via TRANSDERMAL

## 2024-07-02 MED ORDER — ACETAMINOPHEN 10 MG/ML IV SOLN
INTRAVENOUS | Status: AC
  Filled 2024-07-02: qty 100

## 2024-07-02 MED ORDER — ONDANSETRON HCL 4 MG/2ML IJ SOLN
INTRAMUSCULAR | Status: AC
  Filled 2024-07-02: qty 2

## 2024-07-02 MED ORDER — SIMETHICONE 80 MG PO CHEW
80.0000 mg | CHEWABLE_TABLET | Freq: Three times a day (TID) | ORAL | Status: DC
  Administered 2024-07-02 – 2024-07-04 (×5): 80 mg via ORAL
  Filled 2024-07-02 (×5): qty 1

## 2024-07-02 MED ORDER — FAMOTIDINE 20 MG PO TABS
40.0000 mg | ORAL_TABLET | Freq: Two times a day (BID) | ORAL | Status: DC
  Administered 2024-07-03 – 2024-07-04 (×3): 40 mg via ORAL
  Filled 2024-07-02 (×3): qty 2

## 2024-07-02 MED ORDER — FENTANYL CITRATE (PF) 100 MCG/2ML IJ SOLN
INTRAMUSCULAR | Status: AC
  Filled 2024-07-02: qty 2

## 2024-07-02 MED ORDER — OXYTOCIN-SODIUM CHLORIDE 30-0.9 UT/500ML-% IV SOLN
INTRAVENOUS | Status: AC
  Filled 2024-07-02: qty 500

## 2024-07-02 MED ORDER — EPHEDRINE 5 MG/ML INJ
INTRAVENOUS | Status: AC
  Filled 2024-07-02: qty 5

## 2024-07-02 MED ORDER — ACETAMINOPHEN 500 MG PO TABS
1000.0000 mg | ORAL_TABLET | Freq: Four times a day (QID) | ORAL | Status: DC
  Administered 2024-07-02 – 2024-07-04 (×6): 1000 mg via ORAL
  Filled 2024-07-02 (×7): qty 2

## 2024-07-02 MED ORDER — OXYCODONE HCL 5 MG PO TABS: 5.0000 mg | ORAL_TABLET | Freq: Once | ORAL | Status: DC | PRN

## 2024-07-02 MED ORDER — SODIUM CHLORIDE 0.9 % IR SOLN
Status: DC | PRN
  Administered 2024-07-02: 1

## 2024-07-02 MED ORDER — ONDANSETRON HCL 4 MG/2ML IJ SOLN: 4.0000 mg | Freq: Three times a day (TID) | INTRAMUSCULAR | Status: DC | PRN

## 2024-07-02 MED ORDER — TRANEXAMIC ACID-NACL 1000-0.7 MG/100ML-% IV SOLN
INTRAVENOUS | Status: AC
  Filled 2024-07-02: qty 100

## 2024-07-02 MED ORDER — STERILE WATER FOR IRRIGATION IR SOLN
Status: DC | PRN
  Administered 2024-07-02: 1000 mL

## 2024-07-02 MED ORDER — IBUPROFEN 600 MG PO TABS
600.0000 mg | ORAL_TABLET | Freq: Four times a day (QID) | ORAL | Status: DC
  Administered 2024-07-03 – 2024-07-04 (×4): 600 mg via ORAL
  Filled 2024-07-02 (×4): qty 1

## 2024-07-02 MED FILL — Scopolamine TD Patch 72HR 1 MG/3DAYS: TRANSDERMAL | Qty: 1 | Status: AC

## 2024-07-02 MED FILL — Cefazolin Sodium-Dextrose IV Solution 2 GM/100ML-4%: INTRAVENOUS | Qty: 100 | Status: AC

## 2024-07-02 SURGICAL SUPPLY — 28 items
CHLORAPREP W/TINT 26 (MISCELLANEOUS) ×2 IMPLANT
CLAMP UMBILICAL CORD (MISCELLANEOUS) ×1 IMPLANT
CLOTH BEACON ORANGE TIMEOUT ST (SAFETY) ×1 IMPLANT
DERMABOND ADVANCED .7 DNX12 (GAUZE/BANDAGES/DRESSINGS) IMPLANT
DRSG OPSITE POSTOP 4X10 (GAUZE/BANDAGES/DRESSINGS) ×1 IMPLANT
ELECTRODE REM PT RTRN 9FT ADLT (ELECTROSURGICAL) ×1 IMPLANT
EXTRACTOR VACUUM KIWI (MISCELLANEOUS) IMPLANT
GLOVE BIO SURGEON STRL SZ7 (GLOVE) ×1 IMPLANT
GLOVE BIOGEL PI IND STRL 7.0 (GLOVE) ×1 IMPLANT
GOWN STRL REUS W/TWL LRG LVL3 (GOWN DISPOSABLE) ×2 IMPLANT
KIT ABG SYR 3ML LUER SLIP (SYRINGE) IMPLANT
MAT PREVALON FULL STRYKER (MISCELLANEOUS) IMPLANT
NDL HYPO 25X5/8 SAFETYGLIDE (NEEDLE) IMPLANT
NEEDLE HYPO 22GX1.5 SAFETY (NEEDLE) IMPLANT
NS IRRIG 1000ML POUR BTL (IV SOLUTION) ×1 IMPLANT
PACK C SECTION WH (CUSTOM PROCEDURE TRAY) ×1 IMPLANT
PAD OB MATERNITY 4.3X12.25 (PERSONAL CARE ITEMS) ×1 IMPLANT
RTRCTR C-SECT PINK 25CM LRG (MISCELLANEOUS) ×1 IMPLANT
SUT CHROMIC 1 CTX 36 (SUTURE) ×2 IMPLANT
SUT CHROMIC 2 0 CT 1 (SUTURE) ×1 IMPLANT
SUT PDS AB 0 CTX 60 (SUTURE) ×1 IMPLANT
SUT VIC AB 2-0 CT1 TAPERPNT 27 (SUTURE) ×1 IMPLANT
SUT VIC AB 4-0 KS 27 (SUTURE) IMPLANT
SUTURE PLAIN GUT 2.0 ETHICON (SUTURE) IMPLANT
SYR 30ML LL (SYRINGE) IMPLANT
TOWEL OR 17X24 6PK STRL BLUE (TOWEL DISPOSABLE) ×1 IMPLANT
TRAY FOLEY W/BAG SLVR 14FR LF (SET/KITS/TRAYS/PACK) ×1 IMPLANT
WATER STERILE IRR 1000ML POUR (IV SOLUTION) ×1 IMPLANT

## 2024-07-02 NOTE — Op Note (Signed)
 Operative Repor  Pre-Operative Diagnosis: 1) 39+1-week intrauterine pregnancy 2) history of prior cesarean section desires repeat  Postoperative Diagnosis: 1) 39+1-week intrauterine pregnancy 2) history of prior cesarean section desires repeat  Procedure: Repeat low-transverse cesarean section  Surgeon: Dr. Marjorie Gull  Assistant: Dr. Charmaine Oz  Antibiotics: Ancef  2 g, TXA 1 g  Operative Findings: Vigorous female infant in the vertex presentation with Apgar scores of 9 at 1 minute and 9 at 5 minutes.  Birth weight 3450 g, 7 pounds 9.7 ounces normal-appearing ovaries and tubes  Specimen: Placenta for disposal  ZAO:Unujo I/O In: 2300 [I.V.:2000; IV Piggyback:300] Out: 1230 [Urine:600; Blood:630]   Procedure:Ms. Bayley is an 38 year old gravida 3 para 1011 at 78 weeks and 1 days estimated gestational age who presents for cesarean section. Following the appropriate informed consent the patient was brought to the operating room where Spinal anesthesia was administered and found to be adequate. She was placed in the dorsal supine position with a leftward tilt. She was prepped and draped in the normal sterile fashion.  The patient was appropriately identified during a preoperative timeout procedure.  The scalpel was then used to make a Pfannenstiel skin incision which was carried down to the underlying layers of soft tissue to the fascia. The fascia was incised in the midline and the fascial incision was extended laterally with Mayo scissors. The superior aspect of the fascial incision was grasped with Coker clamps x2, tented up and the rectus muscles dissected off sharply with the electrocautery unit. The same procedure was repeated on the inferior aspect of the fascial incision. The rectus muscles were separated in the midline. The abdominal peritoneum was identified, tented up, entered sharply, and the incision was extended superiorly and inferiorly with good visualization of the  bladder. The Alexis retractor was then deployed. The vesicouterine peritoneum was identified, tented up, entered sharply, and the bladder flap was created digitally.  The scalpel was then used to make a low transverse incision on the uterus which was extended laterally with blunt dissection. The fetal vertex was identified, delivered easily through the uterine incision followed by the body. The infant was bulb suctioned on the operative field and cried vigorously.  Following a 1 minute delay, the cord was clamped and cut. The infant was passed to the waiting neonatology team. Placenta was then delivered spontaneously and the uterus was cleared of all clot and debris. The uterine incision was repaired with #1 chromic in running locked fashion followed by a second imbricating layer. The ovaries and tubes were inspected and normal. The Alexis retractor was removed. The abdominal peritoneum was reapproximated with 2-0 Vicryl in a running fashion. The rectus muscles was reapproximated with 2-0 chromic in a running fashion. The fascia was closed with 0-looped PDS in a running fashion.  The subcutaneous tissue was reapproximated with 2-0 plain gut interrupted sutures.  The skin was closed with 4-0 vicryl in a subcuticular fashion and Dermabond. All laparotomy sponge, instrument, and needle counts were correct.  The patient tolerated the procedure well and was transferred to the recovery unit in stable condition following the procedure.

## 2024-07-02 NOTE — Anesthesia Preprocedure Evaluation (Addendum)
 Anesthesia Evaluation  Patient identified by MRN, date of birth, ID band Patient awake    Reviewed: Allergy & Precautions, NPO status , Patient's Chart, lab work & pertinent test results  Airway Mallampati: II  TM Distance: >3 FB Neck ROM: Full    Dental no notable dental hx. (+) Teeth Intact, Dental Advisory Given   Pulmonary neg pulmonary ROS   Pulmonary exam normal breath sounds clear to auscultation       Cardiovascular hypertension, Normal cardiovascular exam Rhythm:Regular Rate:Normal     Neuro/Psych negative neurological ROS  negative psych ROS   GI/Hepatic Neg liver ROS, PUD,,,UC   Endo/Other  negative endocrine ROS    Renal/GU negative Renal ROS  negative genitourinary   Musculoskeletal negative musculoskeletal ROS (+)    Abdominal   Peds  Hematology negative hematology ROS (+)   Anesthesia Other Findings   Reproductive/Obstetrics (+) Pregnancy                              Anesthesia Physical Anesthesia Plan  ASA: 3  Anesthesia Plan: Spinal   Post-op Pain Management:    Induction:   PONV Risk Score and Plan: Treatment may vary due to age or medical condition  Airway Management Planned: Natural Airway  Additional Equipment:   Intra-op Plan:   Post-operative Plan:   Informed Consent: I have reviewed the patients History and Physical, chart, labs and discussed the procedure including the risks, benefits and alternatives for the proposed anesthesia with the patient or authorized representative who has indicated his/her understanding and acceptance.     Dental advisory given  Plan Discussed with: CRNA  Anesthesia Plan Comments:          Anesthesia Quick Evaluation

## 2024-07-02 NOTE — Lactation Note (Signed)
 This note was copied from a baby's chart. Lactation Consultation Note  Patient Name: Loretta Boone Unijb'd Date: 07/02/2024 Age:40 hours Reason for consult: Initial assessment;1st time breastfeeding;Term.See MOB: MR- AMA  P1, Term female infant. MOB did reverse pressure softening prior to latching infant which everted her nipple outward, infant latched on MOB right breast using the cross cradle hold, infant latched with depth, infant was still breastfeeding after 10 minutes when LC left the room. MOB knows to call for further latch assistance if needed. MOB will continue to breastfeed infant by cues, on demand, 8-12 times within 24 hours, skin to skin. LC discussed the importance of maternal rest, meals and hydration. MOB was made aware of O/P services, breastfeeding support groups, community resources, and our phone # for post-discharge questions.    Maternal Data Has patient been taught Hand Expression?: Yes Does the patient have breastfeeding experience prior to this delivery?: No  Feeding Mother's Current Feeding Choice: Breast Milk  LATCH Score Latch: Grasps breast easily, tongue down, lips flanged, rhythmical sucking.  Audible Swallowing: A few with stimulation  Type of Nipple: Flat (When MOB did reverse softening her nipple everted outward)  Comfort (Breast/Nipple): Soft / non-tender  Hold (Positioning): Assistance needed to correctly position infant at breast and maintain latch.  LATCH Score: 7   Lactation Tools Discussed/Used    Interventions Interventions: Breast feeding basics reviewed;Assisted with latch;Skin to skin;Adjust position;Support pillows;Position options;Expressed milk;Breast compression;Education;Pace feeding;Guidelines for Milk Supply and Pumping Schedule Handout;CDC milk storage guidelines;CDC Guidelines for Breast Pump Cleaning;LC Services brochure  Discharge Pump: DEBP;Personal  Consult Status Consult Status: Follow-up Date: 07/03/24 Follow-up  type: In-patient    Grayce LULLA Batter 07/02/2024, 11:37 PM

## 2024-07-02 NOTE — Anesthesia Procedure Notes (Signed)
 Spinal  Patient location during procedure: OR Start time: 07/02/2024 12:40 PM End time: 07/02/2024 12:44 PM Reason for block: surgical anesthesia  Staffing Performed: anesthesiologist  Authorized by: Niels Marien CROME, MD   Performed by: Niels Marien CROME, MD  Preanesthetic Checklist Completed: patient identified, IV checked, risks and benefits discussed, surgical consent, monitors and equipment checked, pre-op evaluation and timeout performed Spinal Block Patient position: sitting Prep: DuraPrep and site prepped and draped Patient monitoring: cardiac monitor, continuous pulse ox and blood pressure Approach: midline Location: L3-4 Injection technique: single-shot Needle Needle type: Pencan  Needle gauge: 24 G Needle length: 9 cm Assessment Sensory level: T6 Events: CSF return  Additional Notes Functioning IV was confirmed and monitors were applied. Sterile prep and drape, including hand hygiene and sterile gloves were used. The patient was positioned and the spine was prepped. The skin was anesthetized with lidocaine .  Free flow of clear CSF was obtained prior to injecting local anesthetic into the CSF.  The spinal needle aspirated freely following injection.  The needle was carefully withdrawn.  The patient tolerated the procedure well.

## 2024-07-02 NOTE — Transfer of Care (Signed)
 Immediate Anesthesia Transfer of Care Note  Patient: Loretta Boone  Procedure(s) Performed: CESAREAN DELIVERY  Patient Location: PACU  Anesthesia Type:Spinal  Level of Consciousness: awake  Airway & Oxygen Therapy: Patient Spontanous Breathing  Post-op Assessment: Report given to RN and Post -op Vital signs reviewed and stable  Post vital signs: Reviewed and stable  Last Vitals:  Vitals Value Taken Time  BP 115/62 07/02/24 13:54  Temp    Pulse 97 07/02/24 13:55  Resp 21 07/02/24 13:55  SpO2 96 % 07/02/24 13:55  Vitals shown include unfiled device data.  Last Pain:  Vitals:   07/02/24 1026  TempSrc: Oral  PainSc: 2          Complications: No notable events documented.

## 2024-07-02 NOTE — H&P (Signed)
 Loretta Boone is a 38 y.o. female presenting for scheduled repeat cesarean section  38 year old G3 P1-0-1-1 at 39+1 presents for scheduled repeat cesarean section.  Overall her pregnancy has been uncomplicated except for an advanced maternal age.  Patient has a prior pregnancy affected by congenital heart defect.  She did have a fetal echo performed during this pregnancy which was normal.  She has a history of ulcerative colitis for which she has been on Entyvio.  Last infusion was at 30 weeks.  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 On OCP, as of 03/2016 advised pt to f/u 1 mo for repeat BP check. pt went to PCP who started her on beta-blocker, pt switched to micronor     H/O: GI bleed    Heart murmur    echo scanned in epic dated 10-14-2022  normal   History of gestational diabetes    History of recurrent UTIs    Hypertension    IDA (iron deficiency anemia)    Numbness and tingling in both hands 10/02/2016   Retained products of conception after miscarriage    Right lower quadrant pain 02/20/2024   US  01/2015 - normal, f/u with PCP - put on ibu and flexeril. 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 lorretta  ([place]) ;  dx 2019  treated with infusions (entyvio)   Wears contact lenses    Past Surgical  History:  Procedure Laterality Date   BREAST MASS EXCISION Left 12/15/2007   @ [facility] by dr c. Loretta Boone  (fibroadenoma)   CESAREAN SECTION N/A 05/19/2019   Procedure: CESAREAN SECTION;  Surgeon: Loretta Gums, MD;  Location: [facility];  Service: Obstetrics;  Laterality: N/A;   COLONOSCOPY WITH PROPOFOL   01/28/2018   dr Loretta Boone   DILATION AND CURETTAGE OF UTERUS N/A 07/11/2023   Procedure: DILATATION AND CURETTAGE;  Surgeon: Loretta Leader, MD;  Location: [facility];  Service: Gynecology;  Laterality: N/A;   LAPAROSCOPY  02/26/2011   Procedure: LAPAROSCOPY OPERATIVE;  Surgeon: Loretta Boone;  Location: [facility] ORS;  Service: Gynecology;  Laterality: N/A;   OVARIAN CYST REMOVAL  02/26/2011   Procedure: OVARIAN CYSTECTOMY;  Surgeon: Loretta Boone;  Location: [facility] ORS;  Service: Gynecology;  Laterality: Right;   Family History: family history includes Colon cancer (age of onset: 51) in her maternal uncle; Colon polyps in her father and mother; Diabetes in her father; Heart disease in her maternal grandfather; Hypertension in her father and mother. Social History:  reports that she has never smoked. She  has never used smokeless tobacco. She reports that she does not currently use alcohol. She reports that she does not use drugs.     Maternal Diabetes: No Genetic Screening: Normal Maternal Ultrasounds/Referrals: Normal Fetal Ultrasounds or other Referrals:  Fetal echo Maternal Substance Abuse:  No Significant Maternal Medications: Entyvio Significant Maternal Lab Results:  None Number of Prenatal Visits:greater than 3 verified prenatal visits Maternal Vaccinations:TDap Other Comments:  None  Review of Systems History   Blood pressure 128/80, pulse 88, temperature 98.2 F (36.8 C), temperature source Oral, resp. rate 16, height 5' 3 (1.6 m), weight 83 kg, last menstrual period 10/02/2023, SpO2 98%. Exam Physical Exam  Alert and orient x 3, NAD Gravid, soft Fetal heart rate  140 Prenatal labs: ABO, Rh: --/--/O POS (12/10 9066) Antibody: NEG (12/10 0933) Rubella: Immune (06/06 0000) RPR: NON REACTIVE (12/10 0931)  HBsAg: Negative (06/06 0000)  HIV: Non-reactive (06/06 0000)  GBS: Negative/-- (11/20 0000)     Assessment/Plan: 1) admit 2) consent for repeat cesarean section 3) SCDs for DVT prophylaxis 4) Ancef  2 g on-call to the OR   Loretta Boone 07/02/2024, 12:23 PM

## 2024-07-03 LAB — CBC
HCT: 28 % — ABNORMAL LOW (ref 36.0–46.0)
Hemoglobin: 9.4 g/dL — ABNORMAL LOW (ref 12.0–15.0)
MCH: 29.3 pg (ref 26.0–34.0)
MCHC: 33.6 g/dL (ref 30.0–36.0)
MCV: 87.2 fL (ref 80.0–100.0)
Platelets: 163 K/uL (ref 150–400)
RBC: 3.21 MIL/uL — ABNORMAL LOW (ref 3.87–5.11)
RDW: 13.1 % (ref 11.5–15.5)
WBC: 13.6 K/uL — ABNORMAL HIGH (ref 4.0–10.5)
nRBC: 0 % (ref 0.0–0.2)

## 2024-07-03 MED ORDER — POLYETHYLENE GLYCOL 3350 17 G PO PACK
17.0000 g | PACK | Freq: Every day | ORAL | Status: DC
  Administered 2024-07-03: 17 g via ORAL
  Filled 2024-07-03 (×2): qty 1

## 2024-07-03 NOTE — Progress Notes (Signed)
 Subjective: Postpartum Day 1: Cesarean Delivery Patient reports increased blurry vision with eye discharge. Pain controlled. Tolerating PO. +flatus, no BM. Requests change of stool softener due to UC   Objective: Vital signs in last 24 hours: Temp:  [97.8 F (36.6 C)-98.6 F (37 C)] 97.8 F (36.6 C) (12/13 0747) Pulse Rate:  [64-98] 65 (12/13 0747) Resp:  [14-20] 18 (12/13 0747) BP: (89-124)/(58-80) 98/58 (12/13 0747) SpO2:  [94 %-99 %] 98 % (12/13 0747)  Physical Exam:  General: alert, cooperative, and no distress Lochia: appropriate Uterine Fundus: firm Incision: Honeycomb in place, clean and dry DVT Evaluation: No cords or calf tenderness. No significant calf/ankle edema.  Recent Labs    07/03/24 0350  HGB 9.4*  HCT 28.0*    Assessment/Plan: 38 yo G3 now P2011 POD#1 s/p eRCS  - PP: routine care  - UC: last infusion in October, next one scheduled for January  Larraine DELENA Sharps, DO 07/03/2024, 11:24 AM

## 2024-07-03 NOTE — Lactation Note (Signed)
 This note was copied from a baby's chart. Lactation Consultation Note  Patient Name: Loretta Boone Date: 07/03/2024 Age:40 hours Reason for consult: Follow-up assessment;Breastfeeding assistance;RN request  P2,  Mother had fibroadenoma and excision on her left breast. First child had cardiac defects and feeding challenges. Mother has been happy this baby has been latching until early this morning, when baby started becoming fussy at the breast and not sustaining latch. Provided education regarding infants during the first day of life.  RN set DEBP and offered a nipple shield to help infant latch. Baby breastfed for 10 min with the NS and father of baby was holding infant when [facility] entered the room.  Baby demonstrating feeding cues sucking her hands when LC entered the room. Mother was agreeable to latching again. Mother prepumped the R breast and latched baby with assistance from [facility].  Baby came off and relatched. Intermittent swallows noted. Mother had good positioning.  Placed pillows under baby for support.   Mother stated she has her personal pumps with her. If baby becomes fussy at the breast again, suggest hand expressing and giving baby colostrum and placing baby skin to skin until she is ready to feed.  If it becomes a repeated pattern, call for assistance and pump.  Maternal Data Has patient been taught Hand Expression?: Yes  Feeding Mother's Current Feeding Choice: Breast Milk  LATCH Score Latch: Grasps breast easily, tongue down, lips flanged, rhythmical sucking.  Audible Swallowing: A few with stimulation  Type of Nipple: Everted at rest and after stimulation  Comfort (Breast/Nipple): Soft / non-tender (short shaft)  Hold (Positioning): Assistance needed to correctly position infant at breast and maintain latch.  LATCH Score: 8   Lactation Tools Discussed/Used    Interventions Interventions: Assisted with latch;Skin to skin;Hand  express;Education  Discharge Pump: DEBP;Hands Free;Personal  Consult Status Consult Status: Follow-up Date: 07/04/24 Follow-up type: In-patient    Shannon Dines [facility] 07/03/2024, 8:40 AM

## 2024-07-04 MED ORDER — ACETAMINOPHEN 500 MG PO TABS: 1000.0000 mg | ORAL_TABLET | Freq: Three times a day (TID) | ORAL | Status: AC

## 2024-07-04 MED ORDER — IBUPROFEN 800 MG PO TABS: 800.0000 mg | ORAL_TABLET | Freq: Three times a day (TID) | ORAL | 0 refills | Status: AC

## 2024-07-04 MED ORDER — POLYETHYLENE GLYCOL 3350 17 G PO PACK: 17.0000 g | PACK | Freq: Every day | ORAL | Status: AC

## 2024-07-04 MED ORDER — OXYCODONE HCL 5 MG PO TABS: 5.0000 mg | ORAL_TABLET | ORAL | 0 refills | Status: AC | PRN

## 2024-07-04 NOTE — Discharge Summary (Signed)
 Postpartum Discharge Summary  Date of Service updated     Patient Name: Loretta Boone DOB: 1986/07/10 MRN: [id number]  Date of admission: 07/02/2024 Delivery date:07/02/2024 Delivering provider: OKEY LEADER Date of discharge: 07/04/2024  Admitting diagnosis: Encounter for maternal care for low transverse scar from repeat cesarean delivery [O34.211] Pregnant [Z34.90] Cesarean delivery delivered [O82] Intrauterine pregnancy: [redacted]w[redacted]d     Secondary diagnosis:  Principal Problem:   Pregnant Active Problems:   Term pregnancy   Cesarean delivery delivered  Additional problems: Ulcerative colitis complicating pregnancy, advanced maternal age    Discharge diagnosis: Term Pregnancy Delivered                                              Post partum procedures:None Augmentation: N/A Complications: None  Hospital course: Sceduled C/S   38 y.o. yo [id number] at [redacted]w[redacted]d was admitted to the hospital 07/02/2024 for scheduled cesarean section with the following indication:Elective Repeat.Delivery details are as follows:  Membrane Rupture Time/Date: 1:06 PM,07/02/2024  Delivery Method:C-Section, Low Transverse Operative Delivery:N/A Details of operation can be found in separate operative note.  Patient had an uncomplicated postpartum course. She is ambulating, tolerating a regular diet, passing flatus, and urinating well. Patient is discharged home in stable condition on  07/04/2024        Newborn Data: Birth date:07/02/2024 Birth time:1:07 PM Gender:Female Living status:Living Apgars:9 ,9  Weight:3450 g    Magnesium Sulfate received: No BMZ received: No Rhophylac:N/A MMR:N/A T-DaP:Given prenatally Flu: No RSV Vaccine received: No Transfusion:No Immunizations administered: Immunization History  Administered Date(s) Administered   PFIZER(Purple Top)SARS-COV-2 Vaccination 09/16/2019, 10/15/2019    Physical exam  Vitals:   07/03/24 0747 07/03/24 1806 07/03/24 1937  07/04/24 0548  BP: (!) 98/58 123/72 112/69 111/66  Pulse: 65 73 74 66  Resp: 18 18 18 18   Temp: 97.8 F (36.6 C) 97.7 F (36.5 C) 98.4 F (36.9 C) 98.1 F (36.7 C)  TempSrc: Oral Oral Oral Oral  SpO2: 98% 98% 98% 99%  Weight:      Height:       General: alert, cooperative, and no distress Lochia: appropriate Uterine Fundus: firm Incision: Honeycomb in place 3x3 area of saturation with brown stained fluid. Dressing changed DVT Evaluation: No cords or calf tenderness. No significant calf/ankle edema. Labs: Lab Results  Component Value Date   WBC 13.6 (H) 07/03/2024   HGB 9.4 (L) 07/03/2024   HCT 28.0 (L) 07/03/2024   MCV 87.2 07/03/2024   PLT 163 07/03/2024      Latest Ref Rng & Units 05/14/2019   12:35 PM  CMP  Glucose 70 - 99 mg/dL 71   BUN 6 - 20 mg/dL 6   Creatinine 9.55 - 8.99 mg/dL 9.36   Sodium 864 - 854 mmol/L 136   Potassium 3.5 - 5.1 mmol/L 4.0   Chloride 98 - 111 mmol/L 105   CO2 22 - 32 mmol/L 20   Calcium  8.9 - 10.3 mg/dL 8.9   Total Protein 6.5 - 8.1 g/dL 6.0   Total Bilirubin 0.3 - 1.2 mg/dL 0.5   Alkaline Phos 38 - 126 U/L 160   AST 15 - 41 U/L 20   ALT 0 - 44 U/L 16    Edinburgh Score:    07/03/2024    9:00 PM  Edinburgh Postnatal Depression Scale Screening Tool  I have been able  to laugh and see the funny side of things. --      After visit meds:  Allergies as of 07/04/2024       Reactions   Minocycline Base Hives, Swelling   Humira (2 Pen) [adalimumab] Hives, Swelling        Medication List     STOP taking these medications    famotidine  40 MG tablet Commonly known as: PEPCID        TAKE these medications    acetaminophen  500 MG tablet Commonly known as: TYLENOL  Take 2 tablets (1,000 mg total) by mouth with breakfast, with lunch, and with evening meal. What changed:  how much to take when to take this reasons to take this   cetirizine 10 MG tablet Commonly known as: ZYRTEC Take 10 mg by mouth at bedtime.    clindamycin 1 % lotion Commonly known as: CLEOCIN T Apply 1 application  topically daily as needed (Acne).   D-MANNOSE PO Take 1 capsule by mouth at bedtime. BW/ Cranberry   Entyvio 300 MG injection Generic drug: vedolizumab Inject into the vein every 8 (eight) weeks.   fluticasone 50 MCG/ACT nasal spray Commonly known as: FLONASE Place 1 spray into both nostrils as needed for allergies or rhinitis.   ibuprofen  800 MG tablet Commonly known as: ADVIL  Take 1 tablet (800 mg total) by mouth with breakfast, with lunch, and with evening meal for 14 days.   multivitamin-prenatal 27-0.8 MG Tabs tablet Take 1 tablet by mouth at bedtime.   oxyCODONE  5 MG immediate release tablet Commonly known as: Oxy IR/ROXICODONE  Take 1 tablet (5 mg total) by mouth every 4 (four) hours as needed for breakthrough pain.   polyethylene glycol 17 g packet Commonly known as: MIRALAX  / GLYCOLAX  Take 17 g by mouth daily.   PROBIOTIC DAILY PO Take 1 capsule by mouth at bedtime.               Discharge Care Instructions  (From admission, onward)           Start     Ordered   07/04/24 0000  Discharge wound care:       Comments: Remove bandage on 12/17. Prior to bandage coming off, you can shower and let soapy water  run over top. After your remove the bandage, let warm soapy water  run over the wound, and clean around scar but avoid directly scrubbing the wound. Pat incision to keep it dry   07/04/24 0837             Discharge home in stable condition Infant Feeding: Bottle and Breast Infant Disposition:home with mother Discharge instruction: per After Visit Summary and Postpartum booklet. Activity: Advance as tolerated. Pelvic rest for 6 weeks.  Diet: routine diet Anticipated Birth Control: Unsure Postpartum Appointment:6 weeks Additional Postpartum F/U: Postpartum depression check and incision check in 2 weeks Future Appointments:No future appointments. Follow up Visit:   Follow-up Information     Ob/Gyn, Landy Stains. Schedule an appointment as soon as possible for a visit in 2 week(s).   Why: For wound re-check Contact information: [address] [place] [address] [phone]                     07/04/2024 Larraine DELENA Sharps, DO

## 2024-07-04 NOTE — Discharge Instructions (Signed)
 Call office with any concerns [phone]

## 2024-07-04 NOTE — Progress Notes (Signed)
 Subjective: Postpartum Day 2: Cesarean Delivery Nipple soreness. Pain controlled with current regimen. Tolerating PO. +flatus  Objective: Vital signs in last 24 hours: Temp:  [97.7 F (36.5 C)-98.4 F (36.9 C)] 98.1 F (36.7 C) (12/14 0548) Pulse Rate:  [66-74] 66 (12/14 0548) Resp:  [18] 18 (12/14 0548) BP: (111-123)/(66-72) 111/66 (12/14 0548) SpO2:  [98 %-99 %] 99 % (12/14 0548)  Physical Exam:  General: alert, cooperative, and no distress Lochia: appropriate Uterine Fundus: firm Incision: Honeycomb in place, brown-soaked drainage within marked area, measuring 3x3 cm DVT Evaluation: No cords or calf tenderness. No significant calf/ankle edema.  Recent Labs    07/03/24 0350  HGB 9.4*  HCT 28.0*    Assessment/Plan: 38 yo G3 now P2011 POD#2 s/p eRCS  - PP: meeting goals, exchange dressing prior to DC - UC: last infusion in October, next one scheduled for January - Dispo: DC home  Larraine DELENA Sharps, DO 07/04/2024, 8:32 AM

## 2024-07-05 NOTE — Anesthesia Postprocedure Evaluation (Signed)
 Anesthesia Post Note  Patient: Loretta Boone  Procedure(s) Performed: CESAREAN DELIVERY     Patient location during evaluation: PACU Anesthesia Type: Spinal Level of consciousness: awake and alert Pain management: pain level controlled Vital Signs Assessment: post-procedure vital signs reviewed and stable Respiratory status: spontaneous breathing, nonlabored ventilation, respiratory function stable and patient connected to nasal cannula oxygen Cardiovascular status: blood pressure returned to baseline and stable Postop Assessment: no apparent nausea or vomiting, no headache, no backache and spinal receding Anesthetic complications: no   No notable events documented.  Last Vitals:  Vitals:   07/03/24 1937 07/04/24 0548  BP: 112/69 111/66  Pulse: 74 66  Resp: 18 18  Temp: 36.9 C 36.7 C  SpO2: 98% 99%    Last Pain:  Vitals:   07/04/24 1219  TempSrc:   PainSc: 0-No pain                 Loretta Boone

## 2024-07-12 ENCOUNTER — Telehealth (HOSPITAL_COMMUNITY): Payer: Self-pay

## 2024-07-12 NOTE — Telephone Encounter (Signed)
 07/12/2024 0932  Name: Loretta Boone MRN: [id number] DOB: Apr 09, 1986  Reason for Call:  Transition of Care Hospital Discharge Call  Contact Status: Patient Contact Status: Complete  Language assistant needed:          Follow-Up Questions: Do You Have Any Concerns About Your Health As You Heal From Delivery?: Yes What Concerns Do You Have About Your Health?: Patient states that her nipples are cracked and bleeding. RN reviewed outpatient lactation appointment information and breastfeeding support group information. RN also told patient to reach out to her OB. Patient states that she is pumping and bottle feeding currently and baby is gaining weight well. RN reviewed using EBM to nipples, coconut oil, and comfort gels for soreness. RN also recommended seeing an LC and ensuring that the right size flange is being used for pumping. Patient has no other concerns or questions. Do You Have Any Concerns About Your Infants Health?: No  Edinburgh Postnatal Depression Scale:  In the Past 7 Days: I have been able to laugh and see the funny side of things.: As much as I always could I have looked forward with enjoyment to things.: As much as I ever did I have blamed myself unnecessarily when things went wrong.: Not very often I have been anxious or worried for no good reason.: Hardly ever I have felt scared or panicky for no good reason.: No, not at all Things have been getting on top of me.: No, most of the time I have coped quite well I have been so unhappy that I have had difficulty sleeping.: Not at all I have felt sad or miserable.: No, not at all I have been so unhappy that I have been crying.: Only occasionally The thought of harming myself has occurred to me.: Never Edinburgh Postnatal Depression Scale Total: 4  PHQ2-9 Depression Scale:     Discharge Follow-up: Edinburgh score requires follow up?: No Patient was advised of the following resources:: Breastfeeding Support  Group, Support Group Patient referred to:: OP Lactation, OB  Post-discharge interventions: Reviewed Newborn Safe Sleep Practices  Signature  Rosaline Deretha PEAK
